# Patient Record
Sex: Male | Born: 1955 | Race: White | Hispanic: No | Marital: Married | Smoking: Never smoker
Health system: Southern US, Community
[De-identification: ages and names within clinical notes are randomized; demographics above are authoritative.]

## PROBLEM LIST (undated history)

## (undated) DIAGNOSIS — D509 Iron deficiency anemia, unspecified: Secondary | ICD-10-CM

## (undated) DIAGNOSIS — J45909 Unspecified asthma, uncomplicated: Secondary | ICD-10-CM

## (undated) DIAGNOSIS — E785 Hyperlipidemia, unspecified: Secondary | ICD-10-CM

## (undated) HISTORY — DX: Hyperlipidemia, unspecified: E78.5

## (undated) HISTORY — PX: HERNIA REPAIR: SHX51

## (undated) HISTORY — DX: Iron deficiency anemia, unspecified: D50.9

## (undated) HISTORY — PX: KNEE SURGERY: SHX244

---

## 2003-08-15 ENCOUNTER — Emergency Department (HOSPITAL_COMMUNITY): Admission: EM | Admit: 2003-08-15 | Discharge: 2003-08-16 | Payer: Self-pay | Admitting: *Deleted

## 2005-01-21 ENCOUNTER — Emergency Department: Payer: Self-pay | Admitting: Emergency Medicine

## 2005-08-07 ENCOUNTER — Ambulatory Visit: Payer: Self-pay | Admitting: Internal Medicine

## 2006-07-02 ENCOUNTER — Ambulatory Visit: Payer: Self-pay | Admitting: Gastroenterology

## 2007-04-27 ENCOUNTER — Emergency Department (HOSPITAL_COMMUNITY): Admission: EM | Admit: 2007-04-27 | Discharge: 2007-04-27 | Payer: Self-pay | Admitting: Emergency Medicine

## 2007-06-14 ENCOUNTER — Ambulatory Visit: Payer: Self-pay | Admitting: Family Medicine

## 2007-06-14 DIAGNOSIS — E785 Hyperlipidemia, unspecified: Secondary | ICD-10-CM

## 2007-06-14 DIAGNOSIS — R5381 Other malaise: Secondary | ICD-10-CM

## 2007-06-14 DIAGNOSIS — R5383 Other fatigue: Secondary | ICD-10-CM

## 2007-06-14 DIAGNOSIS — IMO0002 Reserved for concepts with insufficient information to code with codable children: Secondary | ICD-10-CM | POA: Insufficient documentation

## 2007-06-15 ENCOUNTER — Encounter (INDEPENDENT_AMBULATORY_CARE_PROVIDER_SITE_OTHER): Payer: Self-pay | Admitting: Family Medicine

## 2007-06-17 ENCOUNTER — Telehealth (INDEPENDENT_AMBULATORY_CARE_PROVIDER_SITE_OTHER): Payer: Self-pay | Admitting: *Deleted

## 2007-06-17 ENCOUNTER — Encounter (INDEPENDENT_AMBULATORY_CARE_PROVIDER_SITE_OTHER): Payer: Self-pay | Admitting: Family Medicine

## 2007-06-17 LAB — CONVERTED CEMR LAB
ALT: 42 units/L (ref 0–53)
AST: 25 units/L (ref 0–37)
Albumin: 4.3 g/dL (ref 3.5–5.2)
Basophils Absolute: 0 10*3/uL (ref 0.0–0.1)
Basophils Relative: 1 % (ref 0–1)
Calcium: 9.3 mg/dL (ref 8.4–10.5)
Cholesterol: 192 mg/dL (ref 0–200)
HCT: 41.5 % (ref 39.0–52.0)
LDL Cholesterol: 124 mg/dL — ABNORMAL HIGH (ref 0–99)
Monocytes Absolute: 0.3 10*3/uL (ref 0.1–1.0)
Monocytes Relative: 9 % (ref 3–12)
Platelets: 214 10*3/uL (ref 150–400)
Potassium: 4.2 meq/L (ref 3.5–5.3)
RBC: 5.61 M/uL (ref 4.22–5.81)
Sodium: 139 meq/L (ref 135–145)
TSH: 1.112 microintl units/mL (ref 0.350–5.50)
Triglycerides: 182 mg/dL — ABNORMAL HIGH (ref <150)
VLDL: 36 mg/dL (ref 0–40)
WBC: 4 10*3/uL (ref 4.0–10.5)

## 2007-08-25 ENCOUNTER — Emergency Department (HOSPITAL_COMMUNITY): Admission: EM | Admit: 2007-08-25 | Discharge: 2007-08-25 | Payer: Self-pay | Admitting: Emergency Medicine

## 2008-11-09 ENCOUNTER — Emergency Department (HOSPITAL_COMMUNITY): Admission: EM | Admit: 2008-11-09 | Discharge: 2008-11-09 | Payer: Self-pay | Admitting: Emergency Medicine

## 2009-01-28 ENCOUNTER — Ambulatory Visit (HOSPITAL_COMMUNITY): Admission: RE | Admit: 2009-01-28 | Discharge: 2009-01-28 | Payer: Self-pay | Admitting: Orthopaedic Surgery

## 2009-02-01 ENCOUNTER — Encounter (HOSPITAL_COMMUNITY): Admission: RE | Admit: 2009-02-01 | Discharge: 2009-03-03 | Payer: Self-pay | Admitting: Orthopaedic Surgery

## 2009-03-04 ENCOUNTER — Encounter (HOSPITAL_COMMUNITY): Admission: RE | Admit: 2009-03-04 | Discharge: 2009-03-24 | Payer: Self-pay | Admitting: Orthopaedic Surgery

## 2009-11-09 ENCOUNTER — Emergency Department: Payer: Self-pay | Admitting: Emergency Medicine

## 2010-06-29 LAB — DIFFERENTIAL
Basophils Relative: 1 % (ref 0–1)
Eosinophils Relative: 10 % — ABNORMAL HIGH (ref 0–5)
Monocytes Relative: 8 % (ref 3–12)
Neutro Abs: 1.8 10*3/uL (ref 1.7–7.7)
Neutrophils Relative %: 42 % — ABNORMAL LOW (ref 43–77)

## 2010-06-29 LAB — URINALYSIS, ROUTINE W REFLEX MICROSCOPIC
Glucose, UA: NEGATIVE mg/dL
Protein, ur: NEGATIVE mg/dL
Urobilinogen, UA: 0.2 mg/dL (ref 0.0–1.0)
pH: 6.5 (ref 5.0–8.0)

## 2010-06-29 LAB — CBC
MCV: 76.3 fL — ABNORMAL LOW (ref 78.0–100.0)
Platelets: 207 10*3/uL (ref 150–400)
RBC: 5.51 MIL/uL (ref 4.22–5.81)

## 2010-08-12 NOTE — H&P (Signed)
[name], [name]            ACCOUNT NO.:  [id number]   MEDICAL RECORD NO.:  [id number]          PATIENT TYPE:  AMB   LOCATION:  DAY                           FACILITY:  [facility]   PHYSICIAN:  [name], M.D. DATE OF BIRTH:  [birth year]   DATE OF ADMISSION:  DATE OF DISCHARGE:  LH                              HISTORY & PHYSICAL   CHIEF COMPLAINT:  My right knee hurts.   The patient is a 55 year old male with pain and tenderness in the right  knee.  He has had an on-th-job injury to his right knee in the past.  I  first saw him the office on May 02, 2007.  He had an on-the-job  injury on April 22, 2007.  He slipped in a hole covered by snow and  ice and injured his right knee.  I was concerned about possible meniscal  tear.  An MRI was done at [facility] on May 07, 2007  showing a posterior medial Baker's cyst, joint effusion and AC strain  but no evidence meniscus tear.  The patient was informed of the findings  on February 12 and he stayed out of work for a period of time.  He began  physical therapy.  He was [facility] in [facility]  occasions.  His motion slowly improved and his AC strain slowly  improved.  He was eventually discharged from therapy.  However, his pain  in his knee continued and still had some difficulty.  He had what he  called good days and bad days, weather related and activity related.  I  have followed him in the office.  He was on Voltaren and Flexeril.  He  is also taking hydrocodone for his pain.  On June 10, 2008, he was  having more pain and more tenderness.  It had been over a year and he  was still having difficulty.  I was concerned about a meniscal tear that  may not have been evident on the previous MRI.  I recommended a new MRI  be performed.  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  I explained the findings to him and  recommended consideration for an  MRI.  This is an on-the-job injury and surgery needed to be approved  through [employer].  He had a Production assistant, radio with him.  Eventually approval was given for surgery on the right knee.  It was  scheduled for June 15.  I have explained the procedure of right knee  arthroscopy, the risks and imponderables.  He appears to understand.   He lives in [place] and works in [place] where the injury  occurred.  He denies lung disease, heart disease, kidney disease,  stroke, paralysis, weakness, hypertension, diabetes, cancer, ulcer  disease or circulatory problems.   He has no allergies.   He does not smoke or use alcoholic beverages.  Dr. Karie Schwalbe is his family  physician in [place].  He says the only procedure he has had done is a colonoscopy.  Arthritis  and cancer run in the family.  The patient is married, lives in  [place] as stated.   VITAL SIGNS: Normal.  HEENT: Negative.  He is alert, cooperative, oriented.  NECK:  Supple.  LUNGS: Clear to P and A.  HEART: Regular without murmur heard.  ABDOMEN:  Soft, nontender without masses.  EXTREMITIES: Right knee has some slight effusion, pain and tenderness  more medially with a weakly positive medial McMurray.  Drawer sign is  negative, Lachman negative.  He has no distal edema.  CNS: Intact.  SKIN:  Intact.   IMPRESSION:  Tear right knee medial meniscus, workers compensation  injury.   PLAN:  Arthroscopy of the left knee.  I discussed with the patient the  planned procedure, risks and imponderables.  He appears to understand.  His labs are pending.                                            ______________________________  [name], M.D.     JWK/MEDQ  D:  09/07/2008  T:  09/07/2008  Job:  [id number]

## 2012-09-09 ENCOUNTER — Encounter: Payer: Self-pay | Admitting: *Deleted

## 2012-09-10 ENCOUNTER — Encounter: Payer: Self-pay | Admitting: Cardiovascular Disease

## 2012-09-10 ENCOUNTER — Ambulatory Visit (INDEPENDENT_AMBULATORY_CARE_PROVIDER_SITE_OTHER): Payer: BC Managed Care – PPO | Admitting: Cardiovascular Disease

## 2012-09-10 VITALS — BP 134/60 | HR 58 | Ht 69.0 in | Wt 225.5 lb

## 2012-09-10 DIAGNOSIS — R079 Chest pain, unspecified: Secondary | ICD-10-CM

## 2012-09-10 DIAGNOSIS — R9439 Abnormal result of other cardiovascular function study: Secondary | ICD-10-CM

## 2012-09-10 NOTE — Progress Notes (Signed)
HPI  This is a pleasant 57 year old African American male who was referred by Dr. Dario Guardian for evaluation of chest pain and abnormal nuclear stress test. The patient has no previous cardiac history. He has no history of diabetes, hypertension or tobacco use. There is no family history of coronary artery disease. He does have mild hyperlipidemia. Recently, he had 3 episodes of substernal sharp chest discomfort which lasted only for about 10-15 seconds. One episode happened when he was driving. These episodes have not been exertional. He denies any shortness of breath, nausea, diaphoresis or dizziness. He went to urgent care few weeks ago when he was told his EKG was abnormal and thus was referred for a stress test. He underwent a treadmill nuclear stress test on Friday. He was able to exercise for 5 minutes and 40 seconds. He achieved a maximum heart rate of 139 beats per minute. The scan showed evidence of moderate size inferior wall ischemia with an ejection fraction of 57%.  Allergies  Allergen Reactions  . Hydrocodone     Dizziness Hallucination nervous     No current outpatient prescriptions on file prior to visit.   No current facility-administered medications on file prior to visit.     Past Medical History  Diagnosis Date  . Iron deficiency anemia, unspecified   . Other and unspecified hyperlipidemia      Past Surgical History  Procedure Laterality Date  . Knee surgery      right knee  . Hernia repair       History reviewed. No pertinent family history.   History   Social History  . Marital Status: Married    Spouse Name: N/A    Number of Children: N/A  . Years of Education: N/A   Occupational History  . Not on file.   Social History Main Topics  . Smoking status: Never Smoker   . Smokeless tobacco: Not on file  . Alcohol Use: No  . Drug Use: No  . Sexually Active: Not on file   Other Topics Concern  . Not on file   Social History Narrative  . No  narrative on file     ROS Constitutional: Negative for fever, chills, diaphoresis, activity change, appetite change and fatigue.  HENT: Negative for hearing loss, nosebleeds, congestion, sore throat, facial swelling, drooling, trouble swallowing, neck pain, voice change, sinus pressure and tinnitus.  Eyes: Negative for photophobia, pain, discharge and visual disturbance.  Respiratory: Negative for apnea, cough,  shortness of breath and wheezing.  Cardiovascular: Negative for palpitations and leg swelling.  Gastrointestinal: Negative for nausea, vomiting, abdominal pain, diarrhea, constipation, blood in stool and abdominal distention.  Genitourinary: Negative for dysuria, urgency, frequency, hematuria and decreased urine volume.  Musculoskeletal: Negative for myalgias, back pain, joint swelling, arthralgias and gait problem.  Skin: Negative for color change, pallor, rash and wound.  Neurological: Negative for dizziness, tremors, seizures, syncope, speech difficulty, weakness, light-headedness, numbness and headaches.  Psychiatric/Behavioral: Negative for suicidal ideas, hallucinations, behavioral problems and agitation. The patient is not nervous/anxious.     PHYSICAL EXAM   BP 134/60  Pulse 58  Ht 5\' 9"  (1.753 m)  Wt 225 lb 8 oz (102.286 kg)  BMI 33.29 kg/m2 Constitutional: He is oriented to person, place, and time. He appears well-developed and well-nourished. No distress.  HENT: No nasal discharge.  Head: Normocephalic and atraumatic.  Eyes: Pupils are equal and round. Right eye exhibits no discharge. Left eye exhibits no discharge.  Neck: Normal range  of motion. Neck supple. No JVD present. No thyromegaly present.  Cardiovascular: Normal rate, regular rhythm, normal heart sounds and. Exam reveals no gallop and no friction rub. No murmur heard.  Pulmonary/Chest: Effort normal and breath sounds normal. No stridor. No respiratory distress. He has no wheezes. He has no rales. He  exhibits no tenderness.  Abdominal: Soft. Bowel sounds are normal. He exhibits no distension. There is no tenderness. There is no rebound and no guarding.  Musculoskeletal: Normal range of motion. He exhibits no edema and no tenderness.  Neurological: He is alert and oriented to person, place, and time. Coordination normal.  Skin: Skin is warm and dry. No rash noted. He is not diaphoretic. No erythema. No pallor.  Psychiatric: He has a normal mood and affect. His behavior is normal. Judgment and thought content normal.      ZOX:WRUEA  Bradycardia  -  Negative T-waves  -Possible  Anterolateral  ischemia.   ABNORMAL    ASSESSMENT AND PLAN

## 2012-09-10 NOTE — Assessment & Plan Note (Addendum)
The patient's chest pain is clearly atypical and has been nonexertional. He also has minimal risk factors for coronary artery disease. Nonetheless, he has an abnormal baseline EKG with anterior T wave changes. Recent nuclear stress test showed evidence of inferior wall ischemia. However, this could have been due to diaphragm attenuation especially in the setting of obesity. I think the best option is to proceed with CT angiography of the coronary arteries to identify whether he has coronary artery disease or not.

## 2012-09-10 NOTE — Patient Instructions (Addendum)
Your physician has requested that you have cardiac CT. Cardiac computed tomography (CT) is a painless test that uses an x-ray machine to take clear, detailed pictures of your heart. For further information please visit [email or web address] Please follow instruction sheet as given.  Follow up as needed.

## 2012-09-11 ENCOUNTER — Encounter: Payer: Self-pay | Admitting: *Deleted

## 2012-09-11 ENCOUNTER — Telehealth: Payer: Self-pay

## 2012-09-11 NOTE — Telephone Encounter (Signed)
LMTCB re: scheduling CTA  I left our [place] office # as well as [address] #

## 2012-09-11 NOTE — Telephone Encounter (Signed)
Message copied by [facility], Jordain Radin E on Wed Sep 11, 2012  3:34 PM ------      Message from: Connye Burkitt      Created: Wed Sep 11, 2012 11:33 AM      Regarding: RE: CTA coronary arteries       Can you try? I will try to reach him also. If you reach him can you tell him to call me at [phone]      ----- Message -----         From: Marcelle Overlie, RN         Sent: 09/11/2012  11:06 AM           To: [name] Lloyd-Fate      Subject: RE: CTA coronary arteries                                Thanks! Do I need to keep trying him or will you do this?      ----- Message -----         From: Connye Burkitt         Sent: 09/11/2012  11:03 AM           To: Marcelle Overlie, RN      Subject: FW: CTA coronary arteries                                  Cardiac Ct scheduled for 09/18/12 @ 1 pm with Dr. Fatima Blank to reach patient.       ----- Message -----         From: Britt Bolognese         Sent: 09/10/2012  12:56 PM           To: [name] Lloyd-Fate      Subject: RE: CTA coronary arteries                                OK TO SCHEDULE            BCBS JYNW#[id number] EXP 10-09-12      ----- Message -----         From: Connye Burkitt         Sent: 09/10/2012  11:48 AM           To: Jeannetta Nap      Subject: FW: CTA coronary arteries                                  pls pre-cert and let me know if insurance approves or deny test.      BLUE CROSS BLUE SHIELD/BCBS [place] PPO      ----- Message -----         From: Connye Burkitt         Sent: 09/10/2012  11:47 AM           To: [name] Lloyd-Fate      Subject: FW: CTA coronary arteries                                  test      ----- Message -----  From: Marcelle Overlie, RN         Sent: 09/10/2012  11:32 AM           To: Connye Burkitt, Lch Pre Cert / Auth      Subject: CTA coronary arteries                                    Arida      Complete cardiac CTA at [facility]      Dx:CP and  positive stress test                                           ------

## 2012-09-12 NOTE — Telephone Encounter (Signed)
Per sharonda in [place], she spoke with pt [name] yesterday

## 2012-09-18 ENCOUNTER — Ambulatory Visit (HOSPITAL_COMMUNITY): Admission: RE | Admit: 2012-09-18 | Payer: BC Managed Care – PPO | Source: Ambulatory Visit

## 2012-09-23 ENCOUNTER — Telehealth: Payer: Self-pay

## 2012-09-23 NOTE — Telephone Encounter (Signed)
Message copied by [facility], Jaeden Messer E on Mon Sep 23, 2012 12:19 PM ------      Message from: Lorine Bears A      Created: Mon Sep 23, 2012 12:11 PM      Regarding: RE: fyi       Let PCP know as well.             ----- Message -----         From: Marcelle Overlie, RN         Sent: 09/23/2012  12:05 PM           To: Iran Ouch, MD      Subject: fyi                                                      Pt n/s for cta at [facility] scheduled for 6/17...fyi       ------

## 2012-09-23 NOTE — Telephone Encounter (Signed)
See below

## 2013-01-23 ENCOUNTER — Telehealth: Payer: Self-pay

## 2013-01-23 ENCOUNTER — Encounter: Payer: Self-pay | Admitting: *Deleted

## 2013-01-23 DIAGNOSIS — R079 Chest pain, unspecified: Secondary | ICD-10-CM

## 2013-01-23 NOTE — Telephone Encounter (Signed)
Dr. Dario Guardian, pt called him and  needs clearance for colonoscopy. Colonoscopy is scheduled for Monday. Not sure if pt needs to be seen again.

## 2013-01-23 NOTE — Telephone Encounter (Signed)
I spoke with [name] in the [place] office to schedule the patient's Cardiac CTA. First available is on 02/04/13 at 2:00 pm. Per [name] she will mail the patient's instructions to him. I have discussed this with the patient and he is aware and agreeable. He will come on 11/3 for lab work to be done in the [place] office.

## 2013-01-23 NOTE — Telephone Encounter (Signed)
Reschedule him for cardiac CTA at [facility] to be read by cardiology.

## 2013-01-23 NOTE — Telephone Encounter (Signed)
Spoke w/ pt.  He reports that he needs a colonoscopy, but Dr. Dario Guardian will not give him clearance b/c he did not have his CTA that Dr. Kirke Corin ordered in 6/14. Pt reports that he "had something done.  I didn't miss anything." Informed pt that CTA was cancelled/no show, but he insists that he went. He states that if he needs to resched, he would like to have it sometime next week. Informed pt that I would find out if Dr. Kirke Corin would like to have this rescheduled and call him back.  Please advise.  Thank you.

## 2013-01-27 ENCOUNTER — Ambulatory Visit (INDEPENDENT_AMBULATORY_CARE_PROVIDER_SITE_OTHER): Payer: BC Managed Care – PPO

## 2013-01-27 DIAGNOSIS — R079 Chest pain, unspecified: Secondary | ICD-10-CM

## 2013-01-28 LAB — BASIC METABOLIC PANEL
Calcium: 9.6 mg/dL (ref 8.7–10.2)
Chloride: 103 mmol/L (ref 97–108)
Creatinine, Ser: 0.99 mg/dL (ref 0.76–1.27)
GFR calc non Af Amer: 84 mL/min/{1.73_m2} (ref >59)
Glucose: 80 mg/dL (ref 65–99)

## 2013-02-04 ENCOUNTER — Encounter (HOSPITAL_COMMUNITY): Payer: Self-pay

## 2013-02-04 ENCOUNTER — Ambulatory Visit (HOSPITAL_COMMUNITY)
Admission: RE | Admit: 2013-02-04 | Discharge: 2013-02-04 | Disposition: A | Discharge status: Home / Self Care | Payer: BC Managed Care – PPO | Source: Ambulatory Visit | Attending: Cardiovascular Disease | Admitting: Cardiovascular Disease

## 2013-02-04 VITALS — BP 118/64 | HR 54

## 2013-02-04 DIAGNOSIS — K7689 Other specified diseases of liver: Secondary | ICD-10-CM | POA: Insufficient documentation

## 2013-02-04 DIAGNOSIS — J479 Bronchiectasis, uncomplicated: Secondary | ICD-10-CM | POA: Insufficient documentation

## 2013-02-04 DIAGNOSIS — R079 Chest pain, unspecified: Secondary | ICD-10-CM | POA: Insufficient documentation

## 2013-02-04 DIAGNOSIS — R911 Solitary pulmonary nodule: Secondary | ICD-10-CM | POA: Insufficient documentation

## 2013-02-04 DIAGNOSIS — R9439 Abnormal result of other cardiovascular function study: Secondary | ICD-10-CM

## 2013-02-04 MED ORDER — IOHEXOL 350 MG/ML SOLN
80.0000 mL | Freq: Once | INTRAVENOUS | Status: AC | PRN
  Administered 2013-02-04: 80 mL via INTRAVENOUS

## 2013-02-04 MED ORDER — METOPROLOL TARTRATE 1 MG/ML IV SOLN
INTRAVENOUS | Status: AC
  Filled 2013-02-04: qty 5

## 2013-02-04 MED ORDER — METOPROLOL TARTRATE 1 MG/ML IV SOLN
5.0000 mg | INTRAVENOUS | Status: DC | PRN
  Administered 2013-02-04: 3 mg via INTRAVENOUS
  Filled 2013-02-04: qty 5

## 2013-02-04 MED ORDER — METOPROLOL TARTRATE 1 MG/ML IV SOLN
2.5000 mg | INTRAVENOUS | Status: DC | PRN
  Filled 2013-02-04: qty 5

## 2013-02-04 MED ORDER — NITROGLYCERIN 0.4 MG SL SUBL
0.4000 mg | SUBLINGUAL_TABLET | SUBLINGUAL | Status: DC | PRN
  Filled 2013-02-04: qty 25

## 2013-02-04 MED ORDER — NITROGLYCERIN 0.4 MG SL SUBL
SUBLINGUAL_TABLET | SUBLINGUAL | Status: AC
  Administered 2013-02-04: 0.4 mg
  Filled 2013-02-04: qty 25

## 2013-02-05 ENCOUNTER — Telehealth: Payer: Self-pay

## 2013-02-05 NOTE — Telephone Encounter (Signed)
Spoke w/ pt.  He is aware of results.  

## 2013-02-05 NOTE — Telephone Encounter (Signed)
Message copied by Marilynne Halsted on Wed Feb 05, 2013  9:57 AM ------      Message from: Lorine Bears A      Created: Tue Feb 04, 2013  5:05 PM       Inform patient that CTA was normal. Sent a copy to Dr. Dario Guardian. ------

## 2013-02-13 ENCOUNTER — Ambulatory Visit: Payer: Self-pay | Admitting: Gastroenterology

## 2013-02-19 ENCOUNTER — Ambulatory Visit: Payer: Self-pay | Admitting: Gastroenterology

## 2013-02-19 LAB — CBC WITH DIFFERENTIAL/PLATELET
Basophil #: 0 10*3/uL (ref 0.0–0.1)
Basophil %: 0.9 %
Eosinophil #: 0.3 10*3/uL (ref 0.0–0.7)
Eosinophil %: 6.5 %
Lymphocyte %: 46.7 %
MCHC: 33.1 g/dL (ref 32.0–36.0)
Neutrophil %: 38 %
Platelet: 193 10*3/uL (ref 150–440)
RBC: 5.63 10*6/uL (ref 4.40–5.90)
RDW: 15.7 % — ABNORMAL HIGH (ref 11.5–14.5)

## 2013-02-19 LAB — FERRITIN: Ferritin (ARMC): 194 ng/mL (ref 8–388)

## 2013-02-19 LAB — IRON: Iron: 85 ug/dL (ref 65–175)

## 2013-09-24 NOTE — Telephone Encounter (Signed)
This encounter was created in error - please disregard.

## 2014-10-25 ENCOUNTER — Encounter (HOSPITAL_COMMUNITY): Payer: Self-pay | Admitting: Emergency Medicine

## 2014-10-25 ENCOUNTER — Emergency Department (HOSPITAL_COMMUNITY)
Admission: EM | Admit: 2014-10-25 | Discharge: 2014-10-25 | Disposition: A | Discharge status: Home / Self Care | Payer: Self-pay | Attending: Emergency Medicine | Admitting: Emergency Medicine

## 2014-10-25 DIAGNOSIS — R252 Cramp and spasm: Secondary | ICD-10-CM | POA: Insufficient documentation

## 2014-10-25 DIAGNOSIS — Z862 Personal history of diseases of the blood and blood-forming organs and certain disorders involving the immune mechanism: Secondary | ICD-10-CM | POA: Insufficient documentation

## 2014-10-25 DIAGNOSIS — Z8639 Personal history of other endocrine, nutritional and metabolic disease: Secondary | ICD-10-CM | POA: Insufficient documentation

## 2014-10-25 LAB — CBC WITH DIFFERENTIAL/PLATELET
Basophils Absolute: 0 10*3/uL (ref 0.0–0.1)
Basophils Relative: 0 % (ref 0–1)
EOS ABS: 0.2 10*3/uL (ref 0.0–0.7)
EOS PCT: 3 % (ref 0–5)
HCT: 39.3 % (ref 39.0–52.0)
HEMOGLOBIN: 13.3 g/dL (ref 13.0–17.0)
LYMPHS ABS: 1.8 10*3/uL (ref 0.7–4.0)
LYMPHS PCT: 31 % (ref 12–46)
MCH: 24.8 pg — AB (ref 26.0–34.0)
MCHC: 33.8 g/dL (ref 30.0–36.0)
MCV: 73.3 fL — AB (ref 78.0–100.0)
MONOS PCT: 8 % (ref 3–12)
Monocytes Absolute: 0.4 10*3/uL (ref 0.1–1.0)
Neutro Abs: 3.4 10*3/uL (ref 1.7–7.7)
Neutrophils Relative %: 58 % (ref 43–77)
Platelets: 211 10*3/uL (ref 150–400)
RBC: 5.36 MIL/uL (ref 4.22–5.81)
RDW: 15 % (ref 11.5–15.5)
WBC: 5.8 10*3/uL (ref 4.0–10.5)

## 2014-10-25 LAB — BASIC METABOLIC PANEL
Anion gap: 12 (ref 5–15)
BUN: 12 mg/dL (ref 6–20)
CALCIUM: 9.6 mg/dL (ref 8.9–10.3)
CO2: 23 mmol/L (ref 22–32)
Chloride: 103 mmol/L (ref 101–111)
Creatinine, Ser: 1.3 mg/dL — ABNORMAL HIGH (ref 0.61–1.24)
GFR, EST NON AFRICAN AMERICAN: 59 mL/min — AB (ref >60)
GLUCOSE: 99 mg/dL (ref 65–99)
POTASSIUM: 3.8 mmol/L (ref 3.5–5.1)
SODIUM: 138 mmol/L (ref 135–145)

## 2014-10-25 MED ORDER — SODIUM CHLORIDE 0.9 % IV BOLUS (SEPSIS)
1000.0000 mL | Freq: Once | INTRAVENOUS | Status: AC
  Administered 2014-10-25: 1000 mL via INTRAVENOUS

## 2014-10-25 NOTE — ED Provider Notes (Signed)
CSN: [id number]     Arrival date & time 10/25/14  2033 History   First MD Initiated Contact with Patient 10/25/14 2148     Chief Complaint  Patient presents with  . Muscle Pain     (Consider location/radiation/quality/duration/timing/severity/associated sxs/prior Treatment) HPI Comments: 59 yo male with muscle cramps.  Started today and has been intermittent. Moderate in intensity.  Currently no cramping.  Cramps located in abdominal musculature, neck, and back.  This is in the setting of increased sweating due to heat and work.  He is worried his potassium may be low.   Past Medical History  Diagnosis Date  . Iron deficiency anemia, unspecified   . Other and unspecified hyperlipidemia    Past Surgical History  Procedure Laterality Date  . Knee surgery      right knee  . Hernia repair     No family history on file. History  Substance Use Topics  . Smoking status: Never Smoker   . Smokeless tobacco: Not on file  . Alcohol Use: No    Review of Systems  Respiratory: Negative for shortness of breath.   Cardiovascular: Negative for chest pain.  Gastrointestinal: Negative for nausea, vomiting and diarrhea.  All other systems reviewed and are negative.     Allergies  Hydrocodone  Home Medications   Prior to Admission medications   Not on File   BP 134/71 mmHg  Pulse 69  Temp(Src) 98.6 F (37 C) (Oral)  Resp 14  Ht  (1.753 m)  Wt 133 lb (60.328 kg)  BMI 19.63 kg/m2  SpO2 96% Physical Exam  Constitutional: He is oriented to person, place, and time. He appears well-developed and well-nourished. No distress.  HENT:  Head: Normocephalic and atraumatic.  Eyes: Conjunctivae are normal. No scleral icterus.  Neck: Neck supple.  Cardiovascular: Normal rate and intact distal pulses.   Pulmonary/Chest: Effort normal. No stridor. No respiratory distress.  Abdominal: Soft. Normal appearance. He exhibits no distension. There is no tenderness. There is no rebound and  no guarding.  Musculoskeletal:       Cervical back: He exhibits no tenderness and no spasm.       Thoracic back: He exhibits no tenderness and no spasm.       Lumbar back: He exhibits no tenderness and no spasm.  Neurological: He is alert and oriented to person, place, and time.  Skin: Skin is warm and dry. No rash noted.  Psychiatric: He has a normal mood and affect. His behavior is normal.  Nursing note and vitals reviewed.   ED Course  Procedures (including critical care time) Labs Review Labs Reviewed  CBC WITH DIFFERENTIAL/PLATELET - Abnormal; Notable for the following:    MCV 73.3 (*)    MCH 24.8 (*)    All other components within normal limits  BASIC METABOLIC PANEL - Abnormal; Notable for the following:    Creatinine, Ser 1.30 (*)    GFR calc non Af Amer 59 (*)    All other components within normal limits    Imaging Review No results found.   EKG Interpretation None      MDM   Final diagnoses:  Muscle cramping    59 yo male with intermittent muscle cramping.  Not cramping currently.  He appears mildly dehydrated.  I think IV fluids will help.    Felt better after fluids.  Stable for dc home.   Blake Divine, MD 10/25/14 [phone]

## 2014-10-25 NOTE — ED Notes (Signed)
Pt able to stand and ambulate without difficulty; denies dizziness

## 2014-10-25 NOTE — Discharge Instructions (Signed)

## 2014-10-25 NOTE — ED Notes (Addendum)
Pt c/o upper back and lower abdominal pain onset today. Denies pain at present. Reports improvement of symptoms after fluids

## 2014-10-25 NOTE — ED Notes (Signed)
Pt. reports muscle cramps at upper back and abdomen onset today , pt. stated " I think my Pottasium is high " , denies chest discomfort or palpitations .

## 2015-04-16 ENCOUNTER — Encounter (HOSPITAL_COMMUNITY): Payer: Self-pay

## 2015-04-16 ENCOUNTER — Emergency Department (HOSPITAL_COMMUNITY)
Admission: EM | Admit: 2015-04-16 | Discharge: 2015-04-16 | Disposition: A | Discharge status: Home / Self Care | Payer: Non-veteran care | Attending: Emergency Medicine | Admitting: Emergency Medicine

## 2015-04-16 ENCOUNTER — Emergency Department (HOSPITAL_COMMUNITY): Payer: Non-veteran care

## 2015-04-16 DIAGNOSIS — R05 Cough: Secondary | ICD-10-CM | POA: Diagnosis not present

## 2015-04-16 DIAGNOSIS — J3489 Other specified disorders of nose and nasal sinuses: Secondary | ICD-10-CM | POA: Diagnosis not present

## 2015-04-16 DIAGNOSIS — R509 Fever, unspecified: Secondary | ICD-10-CM | POA: Insufficient documentation

## 2015-04-16 DIAGNOSIS — Z862 Personal history of diseases of the blood and blood-forming organs and certain disorders involving the immune mechanism: Secondary | ICD-10-CM | POA: Diagnosis not present

## 2015-04-16 DIAGNOSIS — R197 Diarrhea, unspecified: Secondary | ICD-10-CM | POA: Insufficient documentation

## 2015-04-16 DIAGNOSIS — Z8639 Personal history of other endocrine, nutritional and metabolic disease: Secondary | ICD-10-CM | POA: Insufficient documentation

## 2015-04-16 DIAGNOSIS — R0989 Other specified symptoms and signs involving the circulatory and respiratory systems: Secondary | ICD-10-CM | POA: Diagnosis not present

## 2015-04-16 DIAGNOSIS — R059 Cough, unspecified: Secondary | ICD-10-CM

## 2015-04-16 MED ORDER — BENZONATATE 100 MG PO CAPS: 100.0000 mg | ORAL_CAPSULE | Freq: Three times a day (TID) | ORAL | Status: DC

## 2015-04-16 MED ORDER — AZITHROMYCIN 250 MG PO TABS: ORAL_TABLET | ORAL | Status: DC

## 2015-04-16 MED ORDER — PSEUDOEPHEDRINE HCL 60 MG PO TABS: 60.0000 mg | ORAL_TABLET | ORAL | Status: DC | PRN

## 2015-04-16 NOTE — ED Notes (Signed)
Pt reports productive cough for the past few days and pain in chest only when coughing.  Unsure if has had fever.  Reports hot and cold episodes.

## 2015-04-16 NOTE — ED Provider Notes (Signed)
CSN: [id number]     Arrival date & time 04/16/15  1007 History  By signing my name below, I, Marica Otter, attest that this documentation has been prepared under the direction and in the presence of No att. providers found. Electronically Signed: Marica Otter, ED Scribe. 04/18/2015. 10:45 AM.  Chief Complaint  Patient presents with  . Cough   The history is provided by the patient. No language interpreter was used.   PCP: Sherrie Mustache, MD HPI Comments: [name] is a 59 y.o. male who presents to the Emergency Department complaining of productive cough onset 3 days ago. Associated Sx include chest tightness secondary to coughing fits, rhinorrhea, watering of eyes, intermittent diarrhea onset Wednesday, intermittent subjective fever onset yesterday. Pt denies constipation, n/v or any other Sx at this time. Pt denies Hx of tobacco use, EtOH use, or illicit drug use. Pt denies sick contact. Pt denies any recent travels.Pt denies any chronic health conditions.   Past Medical History  Diagnosis Date  . Iron deficiency anemia, unspecified   . Other and unspecified hyperlipidemia    Past Surgical History  Procedure Laterality Date  . Knee surgery      right knee  . Hernia repair     No family history on file. Social History  Substance Use Topics  . Smoking status: Never Smoker   . Smokeless tobacco: None  . Alcohol Use: No    Review of Systems  Constitutional: Positive for fever.  HENT: Positive for rhinorrhea.   Eyes: Positive for discharge.  Respiratory: Positive for chest tightness.   Gastrointestinal: Positive for diarrhea. Negative for nausea, vomiting and constipation.  All other systems reviewed and are negative.  Allergies  Hydrocodone  Home Medications   Prior to Admission medications   Medication Sig Start Date End Date Taking? Authorizing Provider  azithromycin (ZITHROMAX Z-PAK) 250 MG tablet 2 po day one, then 1 daily x 4 days 04/16/15   Marily Memos, MD   benzonatate (TESSALON) 100 MG capsule Take 1 capsule (100 mg total) by mouth every 8 (eight) hours. 04/16/15   Marily Memos, MD  pseudoephedrine (SUDAFED) 60 MG tablet Take 1 tablet (60 mg total) by mouth every 4 (four) hours as needed for congestion. 04/16/15   Marily Memos, MD   Triage Vitals: BP 128/69 mmHg  Pulse 61  Temp(Src) 98.6 F (37 C) (Oral)  Resp 20  Ht  (1.753 m)  Wt 228 lb (103.42 kg)  BMI 33.65 kg/m2  SpO2 100% Physical Exam  Constitutional: He is oriented to person, place, and time. He appears well-developed and well-nourished.  HENT:  Head: Normocephalic.  Nose: Mucosal edema present.  Mouth/Throat: Oropharynx is clear and moist and mucous membranes are normal.  Eyes: EOM are normal.  Neck: Normal range of motion.  Cardiovascular: Normal rate and regular rhythm.   No murmur heard. Pulmonary/Chest: Effort normal. No respiratory distress. He has rales in the right lower field.  Abdominal: Soft. Bowel sounds are normal. He exhibits no distension. There is no tenderness.  Musculoskeletal: Normal range of motion.  Neurological: He is alert and oriented to person, place, and time.  Psychiatric: He has a normal mood and affect.  Nursing note and vitals reviewed.   ED Course  Procedures (including critical care time)  DIAGNOSTIC STUDIES: Oxygen Saturation is 100% on ra, nl by my interpretation.    COORDINATION OF CARE: 10:41 AM: Discussed treatment plan which includes Rx for antibiotics, cough meds, Sudafed with pt at bedside; patient  verbalizes understanding and agrees with treatment plan.  Imaging Review Dg Chest 2 View  04/16/2015  CLINICAL DATA:  Chest pain, cough and congestion since 04/12/2014. Initial encounter. EXAM: CHEST  2 VIEW COMPARISON:  PA and lateral chest 08/27/2012. FINDINGS: The lungs are clear. Heart size is normal. There is no pneumothorax or pleural effusion. No focal bony abnormality. IMPRESSION: No acute disease. Electronically Signed    By: Drusilla Kanner M.D.   On: 04/16/2015 10:42   I have personally reviewed and evaluated these images as part of my medical decision-making.  MDM   Final diagnoses:  Cough    Likely bronchitis.  Will start abx 2/2 age, history, associated GI illnesses (Possibly legionella) and duration of symptoms. No indication for continued observation, admission or advanced imaging since patient is not tachypneic, tachycardic or hypoxic at this time.    I personally performed the services described in this documentation, which was scribed in my presence. The recorded information has been reviewed and is accurate.    Marily Memos, MD 04/18/15 [phone]

## 2016-01-17 ENCOUNTER — Emergency Department (HOSPITAL_COMMUNITY)
Admission: EM | Admit: 2016-01-17 | Discharge: 2016-01-17 | Disposition: A | Discharge status: Home / Self Care | Payer: Non-veteran care | Attending: Emergency Medicine | Admitting: Emergency Medicine

## 2016-01-17 ENCOUNTER — Encounter (HOSPITAL_COMMUNITY): Payer: Self-pay | Admitting: Emergency Medicine

## 2016-01-17 ENCOUNTER — Emergency Department (HOSPITAL_COMMUNITY): Payer: Medicare Other

## 2016-01-17 DIAGNOSIS — Z791 Long term (current) use of non-steroidal anti-inflammatories (NSAID): Secondary | ICD-10-CM | POA: Insufficient documentation

## 2016-01-17 DIAGNOSIS — J209 Acute bronchitis, unspecified: Secondary | ICD-10-CM

## 2016-01-17 MED ORDER — DEXAMETHASONE SODIUM PHOSPHATE 4 MG/ML IJ SOLN
10.0000 mg | Freq: Once | INTRAMUSCULAR | Status: AC
  Administered 2016-01-17: 10 mg via INTRAMUSCULAR
  Filled 2016-01-17: qty 3

## 2016-01-17 MED ORDER — AZITHROMYCIN 250 MG PO TABS: 250.0000 mg | ORAL_TABLET | Freq: Every day | ORAL | 0 refills | Status: DC

## 2016-01-17 MED ORDER — ALBUTEROL SULFATE HFA 108 (90 BASE) MCG/ACT IN AERS
2.0000 | INHALATION_SPRAY | RESPIRATORY_TRACT | Status: DC | PRN
  Administered 2016-01-17: 2 via RESPIRATORY_TRACT
  Filled 2016-01-17: qty 6.7

## 2016-01-17 NOTE — ED Triage Notes (Addendum)
Pt reports dx of bronchitis 3 months ago.  Pt states he continues to have bronchitis symptoms.  Pt reports wheezing and fluid in lungs. Pt coughing up yellow sputum.  Pt alert and oriented, airway clear. Pt wanting a refill of antibiotics.

## 2016-01-17 NOTE — ED Provider Notes (Signed)
[facility] Provider Note   CSN: [id number] Arrival date & time: 01/17/16  1130     History   Chief Complaint Chief Complaint  Patient presents with  . Cough    HPI [name] is a 60 y.o. male.  HPI Pt reports dx of bronchitis 3 months ago.  Pt states he continues to have bronchitis symptoms.  Pt reports wheezing and fluid in lungs.  Pt alert and oriented, airway clear. Pt wanting a refill of antibiotics Past Medical History:  Diagnosis Date  . Iron deficiency anemia, unspecified   . Other and unspecified hyperlipidemia     Patient Active Problem List   Diagnosis Date Noted  . Chest pain 09/10/2012  . HYPERLIPIDEMIA 06/14/2007  . MALAISE AND FATIGUE 06/14/2007  . KNEE SPRAIN, RIGHT 06/14/2007    Past Surgical History:  Procedure Laterality Date  . HERNIA REPAIR    . KNEE SURGERY     right knee       Home Medications    Prior to Admission medications   Medication Sig Start Date End Date Taking? Authorizing Provider  ibuprofen (ADVIL,MOTRIN) 200 MG tablet Take 400 mg by mouth every 6 (six) hours as needed.   Yes Historical Provider, MD  azithromycin (ZITHROMAX) 250 MG tablet Take 1 tablet (250 mg total) by mouth daily. Take first 2 tablets together, then 1 every day until finished. 01/17/16   Nelva Nayobert Tresa Jolley, MD    Family History History reviewed. No pertinent family history.  Social History Social History  Substance Use Topics  . Smoking status: Never Smoker  . Smokeless tobacco: Not on file  . Alcohol use No     Allergies   Hydrocodone   Review of Systems Review of Systems  All other systems reviewed and are negative.    Physical Exam Updated Vital Signs BP 126/76 (BP Location: Left Arm)   Pulse 61   Temp 97.7 F (36.5 C) (Oral)   Resp 20   Ht 5\' 9"  (1.753 m)   Wt 225 lb (102.1 kg)   SpO2 98%   BMI 33.23 kg/m   Physical Exam  Constitutional: He is oriented to person, place, and time. He appears well-developed and  well-nourished. No distress.  HENT:  Head: Normocephalic and atraumatic.  Eyes: Pupils are equal, round, and reactive to light.  Neck: Normal range of motion.  Cardiovascular: Normal rate and intact distal pulses.   Pulmonary/Chest: Effort normal. No respiratory distress. He has wheezes.  Abdominal: Soft. Normal appearance and bowel sounds are normal. He exhibits no distension.  Musculoskeletal: Normal range of motion.  Neurological: He is alert and oriented to person, place, and time. No cranial nerve deficit.  Skin: Skin is warm and dry. No rash noted.  Psychiatric: He has a normal mood and affect. His behavior is normal.  Nursing note and vitals reviewed.    ED Treatments / Results  Labs (all labs ordered are listed, but only abnormal results are displayed) Labs Reviewed - No data to display  EKG  EKG Interpretation None       Radiology Dg Chest 2 View  Result Date: 01/17/2016 CLINICAL DATA:  Cough, wheezing for 1 week.  Fever off and on. EXAM: CHEST  2 VIEW COMPARISON:  07/19/2015 FINDINGS: Heart and mediastinal contours are within normal limits. No focal opacities or effusions. No acute bony abnormality. IMPRESSION: No active cardiopulmonary disease. Electronically Signed   By: Charlett NoseKevin  Dover M.D.   On: 01/17/2016 12:21    Procedures Procedures (  including critical care time)  Medications Ordered in ED Medications  dexamethasone (DECADRON) injection 10 mg (not administered)  albuterol (PROVENTIL HFA;VENTOLIN HFA) 108 (90 Base) MCG/ACT inhaler 2 puff (not administered)     Initial Impression / Assessment and Plan / ED Course  I have reviewed the triage vital signs and the nursing notes.  Pertinent labs & imaging results that were available during my care of the patient were reviewed by me and considered in my medical decision making (see chart for details).  Clinical Course      Final Clinical Impressions(s) / ED Diagnoses   Final diagnoses:  Acute  bronchitis, unspecified organism    New Prescriptions New Prescriptions   AZITHROMYCIN (ZITHROMAX) 250 MG TABLET    Take 1 tablet (250 mg total) by mouth daily. Take first 2 tablets together, then 1 every day until finished.     Nelva Nay, MD 01/17/16 [phone]

## 2016-02-21 ENCOUNTER — Ambulatory Visit (INDEPENDENT_AMBULATORY_CARE_PROVIDER_SITE_OTHER): Payer: No Typology Code available for payment source | Admitting: Neurology

## 2016-02-21 ENCOUNTER — Encounter: Payer: Self-pay | Admitting: Neurology

## 2016-02-21 VITALS — BP 138/78 | HR 72 | Resp 18 | Ht 69.0 in | Wt 240.0 lb

## 2016-02-21 DIAGNOSIS — R0681 Apnea, not elsewhere classified: Secondary | ICD-10-CM

## 2016-02-21 DIAGNOSIS — E669 Obesity, unspecified: Secondary | ICD-10-CM | POA: Diagnosis not present

## 2016-02-21 DIAGNOSIS — R0683 Snoring: Secondary | ICD-10-CM

## 2016-02-21 DIAGNOSIS — R400 Somnolence: Secondary | ICD-10-CM | POA: Diagnosis not present

## 2016-02-21 DIAGNOSIS — G2581 Restless legs syndrome: Secondary | ICD-10-CM

## 2016-02-21 DIAGNOSIS — R351 Nocturia: Secondary | ICD-10-CM

## 2016-02-21 NOTE — Patient Instructions (Signed)

## 2016-02-21 NOTE — Progress Notes (Signed)
Subjective:    Patient ID: Austin PlaterRonnie L Campbell is a 60 y.o. male.  HPI     Huston FoleySaima Enzo Treu, MD, PhD [facility] [address], [address] [address] [place], [place] [address]  Dear Dr. Judie GrieveBryan,   I saw your patient, Austin PearlRonnie Campbell, upon your kind request in my neurologic clinic today for initial consultation of his sleep disorder, in particular, concern for obstructive sleep apnea. The patient is unaccompanied today. As you know, Mr. [name] is a 60 year old right-handed gentleman with an underlying medical history of hyperlipidemia, iron deficiency anemia, and obesity, who reports snoring, excessive daytime somnolence and witnessed apneic pauses while asleep per wife's report. [facility] along with his [facility]. His Epworth sleepiness score is 19 out of 24 today, his fatigue score is 33/63. He takes flexeril generic as needed. He is on disability for knee injuries. 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 He has woken up with a sense of gasping sensation, does endorse occasional RLS symptoms and has kicked in his sleep.  He does not endorse morning HAs, and has nocturia 1-2 per night on average.   He does not smoke or drink alcohol. He drinks caffeine occasionally, occasional energy drink, not necessarily a daily caffeine consumer. He has stopped drinking sodas. Bedtime is on the late side, between 1 and 2 AM on average, wakeup time between 6 and 7. He does not typically wake up rested. His mother had obstructive sleep apnea. She is deceased. He lives at home with his wife. He has 4 children.  His Past Medical History Is Significant For: Past Medical History:  Diagnosis Date  . Iron deficiency anemia, unspecified   . Other and unspecified hyperlipidemia     His Past Surgical History Is Significant For: Past Surgical History:  Procedure Laterality Date  . HERNIA REPAIR    . KNEE SURGERY     right knee    His  Family History Is Significant For: Family History  Problem Relation Age of Onset  . Rheum arthritis Mother   . Diabetes Mother   . Cancer Mother     His Social History Is Significant For: Social History   Social History  . Marital status: Married    Spouse name: N/A  . Number of children: 4  . Years of education: BA   Occupational History  . Driver     Social History Main Topics  . Smoking status: Never Smoker  . Smokeless tobacco: Never Used  . Alcohol use No  . Drug use: No  . Sexual activity: Not Asked   Other Topics Concern  . None   Social History Narrative   Drinks about 1 cup of coffee a day, will drink energy drink occasionally     His Allergies Are:  Allergies  Allergen Reactions  . Hydrocodone Other (See Comments)    Dizziness, Hallucination, nervous  :   His Current Medications Are:  Outpatient Encounter Prescriptions as of 02/21/2016  Medication Sig  . predniSONE (DELTASONE) 20 MG tablet TK 1 T PO BID  . [DISCONTINUED] azithromycin (ZITHROMAX) 250 MG tablet Take 1 tablet (250 mg total) by mouth daily. Take first 2 tablets together, then 1 every day until finished.  . [DISCONTINUED] ibuprofen (ADVIL,MOTRIN) 200 MG tablet Take 400 mg by mouth every 6 (six) hours as needed.   No facility-administered encounter medications on file as of 02/21/2016.   :  Review of Systems:  Out of a complete 14 point review of systems, all are reviewed and negative with the exception of these symptoms as listed below: Review of Systems  Constitutional: Positive for fatigue.  Neurological:       Patient has trouble falling and staying asleep, snoring, witnessed apnea, wakes up feeling tired, daytime fatigue, sometimes takes naps.    Epworth Sleepiness Scale 0= would never doze 1= slight chance of dozing 2= moderate chance of dozing 3= high chance of dozing  Sitting and reading:3 Watching TV:3 Sitting inactive in a public place (ex. Theater or meeting):3 As a  passenger in a car for an hour without a break:2 Lying down to rest in the afternoon:2 Sitting and talking to someone:2 Sitting quietly after lunch (no alcohol):2 In a car, while stopped in traffic:2 Total:19  Objective:  Neurologic Exam  Physical Exam Physical Examination:   Vitals:   02/21/16 1117  BP: 138/78  Pulse: 72  Resp: 18    General Examination: The patient is a very pleasant 60 y.o. male in no acute distress. He appears well-developed and well-nourished and well groomed.   HEENT: Normocephalic, atraumatic, pupils are equal, round and reactive to light and accommodation. Funduscopic exam is normal with sharp disc margins noted. Extraocular tracking is good without limitation to gaze excursion or nystagmus noted. Normal smooth pursuit is noted. Hearing is grossly intact. Tympanic membranes are clear bilaterally. Face is symmetric with normal facial animation and normal facial sensation. Speech is clear with no dysarthria noted. There is no hypophonia. There is no lip, neck/head, jaw or voice tremor. Neck is supple with full range of passive and active motion. There are no carotid bruits on auscultation. Oropharynx exam reveals: mild mouth dryness, adequate dental hygiene and marked airway crowding, due to large tongue and redundant soft palate. Mallampati is class III. Tongue protrudes centrally and palate elevates symmetrically. Tonsils are not fully visualized. Neck size is 20 inches. He has a Mild overbite. Nasal inspection reveals no significant nasal mucosal bogginess or redness and no septal deviation.   Chest: Clear to auscultation without wheezing, rhonchi or crackles noted.  Heart: S1+S2+0, regular and normal without murmurs, rubs or gallops noted.   Abdomen: Soft, non-tender and non-distended with normal bowel sounds appreciated on auscultation.  Extremities: There is no pitting edema in the distal lower extremities bilaterally. Pedal pulses are intact.  Skin: Warm  and dry without trophic changes noted. There are no varicose veins.  Musculoskeletal: exam reveals no obvious joint deformities, tenderness or joint swelling or erythema.   Neurologically:  Mental status: The patient is awake, alert and oriented in all 4 spheres. His immediate and remote memory, attention, language skills and fund of knowledge are appropriate. There is no evidence of aphasia, agnosia, apraxia or anomia. Speech is clear with normal prosody and enunciation. Thought process is linear. Mood is normal and affect is normal.  Cranial nerves II - XII are as described above under HEENT exam. In addition: shoulder shrug is normal with equal shoulder height noted. Motor exam: Normal bulk, strength and tone is noted. There is no drift, tremor or rebound. Romberg is negative. Reflexes are 2+ throughout in the upper extremities, 1+ in the left knee and absent in the right knee. Fine motor skills and coordination: intact with normal finger taps, normal hand movements, normal rapid alternating patting, normal foot taps and normal foot agility.  Cerebellar testing: No dysmetria or intention tremor on finger to nose testing. Heel to shin is unremarkable bilaterally. There  is no truncal or gait ataxia.  Sensory exam: intact to light touch, pinprick, vibration, temperature sense in the upper and lower extremities.  Gait, station and balance: He stands easily. No veering to one side is noted. No leaning to one side is noted. Posture is age-appropriate and stance is narrow based. Gait shows normal stride length and normal pace. No problems turning are noted. Tandem walk is slightly difficult for him.  Assessment and plan:  In summary, [name] is a very pleasant 60 y.o.-year old male with an underlying medical history of hyperlipidemia, iron deficiency anemia, history of knee injuries, status post right knee arthroscopic surgery in 2011, and obesity, whose history and physical exam are in keeping  with obstructive sleep apnea (OSA). In addition, he endorses RLS symptoms.  I had a long chat with the patient about my findings and the diagnosis of OSA, its prognosis and treatment options. We talked about medical treatments, surgical interventions and non-pharmacological approaches. I explained in particular the risks and ramifications of untreated moderate to severe OSA, especially with respect to developing cardiovascular disease down the Road, including congestive heart failure, difficult to treat hypertension, cardiac arrhythmias, or stroke. Even type 2 diabetes has, in part, been linked to untreated OSA. Symptoms of untreated OSA include daytime sleepiness, memory problems, mood irritability and mood disorder such as depression and anxiety, lack of energy, as well as recurrent headaches, especially morning headaches. We talked about trying to maintain a healthy lifestyle in general, as well as the importance of weight control. I encouraged the patient to eat healthy, exercise daily and keep well hydrated, to keep a scheduled bedtime and wake time routine, to not skip any meals and eat healthy snacks in between meals. I advised the patient not to drive when feeling sleepy. I recommended the following at this time: sleep study with potential positive airway pressure titration. (We will score hypopneas at 4% and split the sleep study into diagnostic and treatment portion, if the estimated. 2 hour AHI is >15/h).   I explained the sleep test procedure to the patient and also outlined possible surgical and non-surgical treatment options of OSA, including the use of a custom-made dental device (which would require a referral to a specialist dentist or oral surgeon), upper airway surgical options, such as pillar implants, radiofrequency surgery, tongue base surgery, and UPPP (which would involve a referral to an ENT surgeon). Rarely, jaw surgery such as mandibular advancement may be considered.  I also  explained the CPAP treatment option to the patient, who indicated that he would be willing to try CPAP if the need arises. I explained the importance of being compliant with PAP treatment, not only for insurance purposes but primarily to improve His symptoms, and for the patient's long term health benefit, including to reduce His cardiovascular risks. I answered all his questions today and the patient was in agreement. I would like to see him back after the sleep study is completed and encouraged him to call with any interim questions, concerns, problems or updates.   Thank you very much for allowing me to participate in the care of this nice patient. If I can be of any further assistance to you please do not hesitate to call me at [phone].  Sincerely,   Huston FoleySaima Barney Russomanno, MD, PhD

## 2016-09-19 ENCOUNTER — Emergency Department (HOSPITAL_COMMUNITY)
Admission: EM | Admit: 2016-09-19 | Discharge: 2016-09-19 | Disposition: A | Discharge status: Home / Self Care | Payer: Non-veteran care | Attending: Emergency Medicine | Admitting: Emergency Medicine

## 2016-09-19 ENCOUNTER — Encounter (HOSPITAL_COMMUNITY): Payer: Self-pay | Admitting: Emergency Medicine

## 2016-09-19 ENCOUNTER — Emergency Department (HOSPITAL_COMMUNITY): Payer: Non-veteran care

## 2016-09-19 DIAGNOSIS — J40 Bronchitis, not specified as acute or chronic: Secondary | ICD-10-CM | POA: Insufficient documentation

## 2016-09-19 DIAGNOSIS — R059 Cough, unspecified: Secondary | ICD-10-CM

## 2016-09-19 DIAGNOSIS — R05 Cough: Secondary | ICD-10-CM

## 2016-09-19 DIAGNOSIS — R062 Wheezing: Secondary | ICD-10-CM | POA: Diagnosis present

## 2016-09-19 HISTORY — DX: Unspecified asthma, uncomplicated: J45.909

## 2016-09-19 MED ORDER — ALBUTEROL (5 MG/ML) CONTINUOUS INHALATION SOLN
10.0000 mg/h | INHALATION_SOLUTION | RESPIRATORY_TRACT | Status: DC
  Filled 2016-09-19: qty 20

## 2016-09-19 MED ORDER — PREDNISONE 20 MG PO TABS: ORAL_TABLET | ORAL | 0 refills | Status: DC

## 2016-09-19 MED ORDER — IPRATROPIUM BROMIDE 0.02 % IN SOLN
1.0000 mg | Freq: Once | RESPIRATORY_TRACT | Status: AC
  Administered 2016-09-19: 1 mg via RESPIRATORY_TRACT
  Filled 2016-09-19: qty 5

## 2016-09-19 MED ORDER — PREDNISONE 50 MG PO TABS
60.0000 mg | ORAL_TABLET | Freq: Once | ORAL | Status: AC
  Administered 2016-09-19: 60 mg via ORAL
  Filled 2016-09-19: qty 1

## 2016-09-19 NOTE — ED Notes (Signed)
Rt aware of tx 

## 2016-09-19 NOTE — ED Provider Notes (Signed)
[facility] Provider Note   CSN: [id number] Arrival date & time: 09/19/16  1206     History   Chief Complaint Chief Complaint  Patient presents with  . Wheezing  . Cough    HPI [name] is a 61 y.o. male.   Wheezing   This is a recurrent problem. The current episode started more than 1 week ago. The problem occurs constantly. The problem has not changed since onset.Associated symptoms include coryza and cough. He has tried nothing for the symptoms.  Cough  Associated symptoms include wheezing.    Past Medical History:  Diagnosis Date  . Asthma   . Iron deficiency anemia, unspecified   . Other and unspecified hyperlipidemia     Patient Active Problem List   Diagnosis Date Noted  . Chest pain 09/10/2012  . HYPERLIPIDEMIA 06/14/2007  . MALAISE AND FATIGUE 06/14/2007  . KNEE SPRAIN, RIGHT 06/14/2007    Past Surgical History:  Procedure Laterality Date  . HERNIA REPAIR    . KNEE SURGERY     right knee       Home Medications    Prior to Admission medications   Medication Sig Start Date End Date Taking? Authorizing Provider  predniSONE (DELTASONE) 20 MG tablet 3 tabs po daily x 3 days, then 2 tabs x 3 days, then 1.5 tabs x 3 days, then 1 tab x 3 days, then 0.5 tabs x 3 days 09/19/16   Robbyn Hodkinson, Barbara Cower, MD    Family History Family History  Problem Relation Age of Onset  . Rheum arthritis Mother   . Diabetes Mother   . Cancer Mother     Social History Social History  Substance Use Topics  . Smoking status: Never Smoker  . Smokeless tobacco: Never Used  . Alcohol use No     Allergies   Hydrocodone   Review of Systems Review of Systems  Respiratory: Positive for cough and wheezing.   All other systems reviewed and are negative.    Physical Exam Updated Vital Signs BP 124/67 (BP Location: Left Arm)   Pulse 66   Temp 98.1 F (36.7 C) (Oral)   Resp 20   Ht 5\' 9"  (1.753 m)   Wt 104.3 kg (230 lb)   SpO2 95%   BMI 33.97  kg/m   Physical Exam  Constitutional: He appears well-developed and well-nourished.  HENT:  Head: Normocephalic and atraumatic.  Eyes: Conjunctivae and EOM are normal.  Neck: Normal range of motion.  Cardiovascular: Normal rate.   Pulmonary/Chest: Effort normal. Tachypnea noted. No respiratory distress. He has decreased breath sounds. He has wheezes.  Abdominal: Soft. He exhibits no distension.  Musculoskeletal: Normal range of motion.  Neurological: He is alert.  Skin: Skin is warm and dry.  Nursing note and vitals reviewed.    ED Treatments / Results  Labs (all labs ordered are listed, but only abnormal results are displayed) Labs Reviewed - No data to display  EKG  EKG Interpretation None       Radiology Dg Chest 2 View  Result Date: 09/19/2016 CLINICAL DATA:  61 year old with current history of asthma, presenting with 8 month history of chronic cough and wheezing. Nonsmoker. EXAM: CHEST  2 VIEW COMPARISON:  04/15/2016, 02/13/2016 and earlier, including CT chest 03/08/2016. FINDINGS: Cardiac silhouette normal in size, unchanged. Minimal atherosclerosis involving the aortic arch, unchanged. Hilar and mediastinal contours otherwise unremarkable. Mild central peribronchial thickening, unchanged. No new pulmonary parenchymal abnormalities. No pleural effusions. Degenerative changes and DISH involving  the thoracic spine. IMPRESSION: Stable mild changes of chronic bronchitis and/or asthma. No acute cardiopulmonary disease. Electronically Signed   By: Hulan Saashomas  Lawrence M.D.   On: 09/19/2016 12:44    Procedures Procedures (including critical care time)  Medications Ordered in ED Medications  albuterol (PROVENTIL,VENTOLIN) solution continuous neb (10 mg/hr Nebulization New Bag/Given 09/19/16 1416)  ipratropium (ATROVENT) nebulizer solution 1 mg (1 mg Nebulization Given 09/19/16 1417)  predniSONE (DELTASONE) tablet 60 mg (60 mg Oral Given 09/19/16 1437)     Initial Impression /  Assessment and Plan / ED Course  I have reviewed the triage vital signs and the nursing notes.  Pertinent labs & imaging results that were available during my care of the patient were reviewed by me and considered in my medical decision making (see chart for details).     Likely asthma exacerbation vs bronchitis. Improved here after steroids/albuterol/ipratropium. Ambulates on pulse ox without desaturation or tachypnea and feels much improved.  Will dc on prolonged taper of steroids as this is the 4th or 5th episode of this in last 6 months. [facility] type of [facility]' but patient is not sure what it is. [facility] and request [facility]ology consult there if not feeling better.   Final Clinical Impressions(s) / ED Diagnoses   Final diagnoses:  Cough  Bronchitis    New Prescriptions New Prescriptions   PREDNISONE (DELTASONE) 20 MG TABLET    3 tabs po daily x 3 days, then 2 tabs x 3 days, then 1.5 tabs x 3 days, then 1 tab x 3 days, then 0.5 tabs x 3 days     Marily MemosMesner, Takisha Pelle, MD 09/19/16 [phone]

## 2016-09-19 NOTE — ED Notes (Signed)
Ambulated Pt Pt spo2 was 98 pulse was 66

## 2016-09-19 NOTE — ED Notes (Signed)
Patient stated had chest pain of 6 when he coughed.

## 2016-09-19 NOTE — ED Notes (Signed)
Rt aware of pt

## 2016-09-19 NOTE — ED Notes (Signed)
Patient transported to X-ray 

## 2016-09-19 NOTE — ED Triage Notes (Signed)
Pt reports productive cough with yellow sputum, shortness of breath on exertion, and wheezing for 3 weeks.  No relief with home nebs, but last one used last night.

## 2016-09-19 NOTE — ED Notes (Signed)
Pt receiving tx

## 2017-01-29 ENCOUNTER — Ambulatory Visit (HOSPITAL_COMMUNITY): Payer: Non-veteran care | Attending: Internal Medicine | Admitting: Physical Therapy

## 2017-01-29 DIAGNOSIS — R262 Difficulty in walking, not elsewhere classified: Secondary | ICD-10-CM | POA: Insufficient documentation

## 2017-01-29 DIAGNOSIS — M5441 Lumbago with sciatica, right side: Secondary | ICD-10-CM | POA: Insufficient documentation

## 2017-01-29 DIAGNOSIS — G8929 Other chronic pain: Secondary | ICD-10-CM | POA: Insufficient documentation

## 2017-01-29 DIAGNOSIS — R2689 Other abnormalities of gait and mobility: Secondary | ICD-10-CM | POA: Insufficient documentation

## 2017-01-29 DIAGNOSIS — M25551 Pain in right hip: Secondary | ICD-10-CM | POA: Insufficient documentation

## 2017-01-29 DIAGNOSIS — M25552 Pain in left hip: Secondary | ICD-10-CM | POA: Insufficient documentation

## 2017-01-29 DIAGNOSIS — M5442 Lumbago with sciatica, left side: Secondary | ICD-10-CM | POA: Insufficient documentation

## 2017-02-13 ENCOUNTER — Ambulatory Visit (HOSPITAL_COMMUNITY): Payer: Non-veteran care | Admitting: Physical Therapy

## 2017-02-14 ENCOUNTER — Encounter (HOSPITAL_COMMUNITY): Payer: Self-pay | Admitting: Physical Therapy

## 2017-02-14 ENCOUNTER — Other Ambulatory Visit: Payer: Self-pay

## 2017-02-14 ENCOUNTER — Ambulatory Visit (HOSPITAL_COMMUNITY): Payer: Non-veteran care | Admitting: Physical Therapy

## 2017-02-14 DIAGNOSIS — M25551 Pain in right hip: Secondary | ICD-10-CM

## 2017-02-14 DIAGNOSIS — M5441 Lumbago with sciatica, right side: Secondary | ICD-10-CM | POA: Diagnosis present

## 2017-02-14 DIAGNOSIS — G8929 Other chronic pain: Secondary | ICD-10-CM | POA: Diagnosis present

## 2017-02-14 DIAGNOSIS — M25552 Pain in left hip: Secondary | ICD-10-CM

## 2017-02-14 DIAGNOSIS — R2689 Other abnormalities of gait and mobility: Secondary | ICD-10-CM

## 2017-02-14 DIAGNOSIS — M5442 Lumbago with sciatica, left side: Secondary | ICD-10-CM | POA: Diagnosis present

## 2017-02-14 DIAGNOSIS — R262 Difficulty in walking, not elsewhere classified: Secondary | ICD-10-CM

## 2017-02-14 NOTE — Therapy (Addendum)
[facility] [address] [place], [place], [address] Phone: [phone]   Fax:  [phone]  Physical Therapy Evaluation  Patient Details  Name: [name] MRN: [id number] Date of Birth: 1956/01/25 Referring Provider: Glenford Peers   Encounter Date: 02/14/2017    Past Medical History:  Diagnosis Date  . Asthma   . Iron deficiency anemia, unspecified   . Other and unspecified hyperlipidemia     Past Surgical History:  Procedure Laterality Date  . HERNIA REPAIR    . KNEE SURGERY     right knee    There were no vitals filed for this visit.     [facility] PT Assessment - 02/14/17 0001      Assessment   Medical Diagnosis  Low Back Pain/Hip Pain    Referring Provider  Glenford Peers    Onset Date/Surgical Date  01/26/16 greater than 1 year ago    Next MD Visit  unsure    Prior Therapy  PT for both knees, hips about 6 months ago      Precautions   Precautions  None      Restrictions   Weight Bearing Restrictions  No      Balance Screen   Has the patient fallen in the past 6 months  No    Has the patient had a decrease in activity level because of a fear of falling?   Yes    Is the patient reluctant to leave their home because of a fear of falling?   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, dispensing and he can' tlift luggage from bus cargo  hold or sit for long time to drive (1-0+ hours), he is currently on disabilty now loading unloading luhgge (disability)     Leisure  [employer], rides motorcycle and road bike but cannot tolerate more than 30 minutes which still increases his pain       Cognition   Overall Cognitive Status  Within Functional Limits for tasks assessed      Sensation   Additional Comments  Patient reports some tingling down left lateral thigh and leg (intermitten)      Functional Tests   Functional tests  Squat;Single leg stance      Squat   Comments  Pateint with decreased depth, decreased hip flexion, forward head, early heel rise increased hip pain bilaterally      Single Leg Stance   Comments  RLE = 15 seconds; LLE = 13 seconds; Patient with low gaurd posture to increase BOS/COM      Posture/Postural Control   Posture/Postural Control  No significant limitations    Posture Comments  pateint with decreased lumbar lordosis in standing and sitting      AROM   Overall  AROM Comments  BLE knee/ankle ROM WFL    Right Hip Extension  2    Right Hip Flexion  89 hard end feel with overpressure    Right Hip External Rotation   38 painful at end range; hard end feel with overpressure    Right Hip Internal Rotation   40 painful at end range; hard end feel with overpressure    Left Hip Extension  1    Left Hip Flexion  80 hard end feel with overpressure    Left Hip External Rotation   38 painful at end range; hard end feel with overpressure    Left Hip Internal Rotation   36 painful at end range; hard end feel with overpressure    Lumbar Flexion  25% limited relieves hip pain    Lumbar Extension  75% limited relieves hip pain    Lumbar - Right Side Bend  25% limited relieves hip pain    Lumbar - Left Side Bend  25% limited relieves hip pain    Lumbar - Right Rotation  25% limited relieves hip pain    Lumbar - Left Rotation  25% limited  relieves hip pain      Strength   Right Hip Flexion  4+/5     Right Hip Extension  4-/5    Right Hip External Rotation   4-/5 limited testing by pain    Right Hip Internal Rotation  4-/5 limited testing by pain    Right Hip ABduction  4-/5 patient compensating with TFL    Left Hip Flexion  4+/5    Left Hip Extension  4-/5    Left Hip External Rotation  4-/5 limited testing by pain    Left Hip Internal Rotation  4-/5 limited testing by pain    Left Hip ABduction  4-/5 patient compensating with TFL    Right Knee Flexion  5/5    Right Knee Extension  5/5    Left Knee Flexion  5/5    Left Knee Extension  5/5      Flexibility   Soft Tissue Assessment /Muscle Length  yes    Hamstrings  moderate limitation BLE      Special Tests    Special Tests  Sacrolliac Tests;Hip Special Tests;Lumbar    Lumbar Tests  Slump Test;FABER test;Straight Leg Raise    Sacroiliac Tests   Sacral Compression    Hip Special Tests   Other;Hip Scouring      FABER test   findings  Positive    Comment  BLE, hard end feel      Slump test   Findings  Positive    Comment  positive with dorsiflexion and relieved by cervical extension BLE      Straight Leg Raise   Findings  Negative    Comment  hamstring limitation      Pelvic Dictraction   Findings  Negative      Pelvic Compression   Findings  Negative      Sacral thrust    Findings  Negative      Gaenslen's test   Findings  Positive    Comments  provocked hip pain BLE      Sacral Compression   Findings  Negative      Hip Scouring   Findings  Positive    Comments  posteralateral hip pan BLE      other   Findings  Positive    Comments  FADDIR: BLE; patin and limited mobility  Ambulation/Gait   Ambulation Distance (Feet)  540 Feet    Gait Pattern  Step-through pattern;Decreased step length - left;Decreased stride length;Decreased hip/knee flexion - right;Decreased hip/knee flexion - left;Antalgic;Trendelenburg;Wide base of support;Lateral trunk lean to right    Ambulation Surface  Level    Gait  velocity  0.91 m/s      6 minute walk test results    Aerobic Endurance Distance Walked  540    Endurance additional comments  pain began increasing at 2 minutes      Objective measurements completed on examination: See above findings.           PT Short Term Goals - 02/14/17 1135      PT SHORT TERM GOAL #1   Title  Patient will be compliant with HEP (participating daily) and be able to demonstrate proper form for exercises    Time  2    Period  Weeks    Status  New    Target Date  02/28/17      PT SHORT TERM GOAL #2   Title  Patient will have increased hip ROM in BLE for flexion to 105*, extension to 8*, IR to 40* , and ER to 45* for improved functional movement    Time  3    Period  Weeks    Status  New    Target Date  03/07/17      PT SHORT TERM GOAL #3   Title  Patient will be able to perform 3MWT without onset of pain and increase his distance by 100' to demonstrate improved activity tolerance    Time  3    Period  Weeks    Status  New      PT SHORT TERM GOAL #4   Title  Patient will improve SLS on BLE to 30 seconds to demonstrate improved balance for functional mobility with ascending/descending stairs    Time  3    Period  Weeks    Status  New      PT SHORT TERM GOAL #5   Title  Patient will improve MMT byt at least 1/2 grade with all muscle groups tested to demonstrate improved functional strength    Time  3    Period  Weeks    Status  New        PT Long Term Goals - 02/14/17 1145      PT LONG TERM GOAL #1   Title  Patient will have increased hip ROM in BLE for flexion to 115*, extension to 10*, IR to 40* , and ER to 45* for improved functional movement    Time  6    Period  Weeks    Status  New    Target Date  03/28/17      PT LONG TERM GOAL #2   Title  Patient will have demosntrate normal posture during functional squat with increased depth, no early heel rise, incresaed hip flexion to improve functional llifting mechanics    Time  6     Period  Weeks    Status  New      PT LONG TERM GOAL #3   Title  Patient will be able to perform 6MWT without onset of pain with gait velocity of  >/= 1.4541m/s to demonstrate improved activity tolerance and safe community ambulation    Time  6    Period  Weeks    Status  New      PT LONG TERM GOAL #4  Title  Patient will improve MMT byt at least 1 grade with all muscle groups tested to demonstrate improved functional strength    Time  6    Period  Weeks    Status  New      PT LONG TERM GOAL #5   Title  Patient will be participating in regular (3-4/week) walking program independently or at gym to be able to return to leisure activities such as walking his dog    Time  6    Period  Weeks    Status  New          Patient will benefit from skilled therapeutic intervention in order to improve the following deficits and impairments:  Abnormal gait, Decreased endurance, Hypomobility, Increased fascial restricitons, Decreased strength, Decreased activity tolerance, Decreased mobility, Difficulty walking, Improper body mechanics, Decreased range of motion, Impaired flexibility, Postural dysfunction  Visit Diagnosis: Chronic bilateral low back pain with bilateral sciatica - Plan: PT plan of care cert/re-cert  Pain in left hip - Plan: PT plan of care cert/re-cert  Pain in right hip - Plan: PT plan of care cert/re-cert  Difficulty in walking, not elsewhere classified - Plan: PT plan of care cert/re-cert  Other abnormalities of gait and mobility - Plan: PT plan of care cert/re-cert      PT G-Codes  Functional Assessment Tool Used (Outpatient Only) FOTO, objective testing, clinical judgement  Functional Limitation Mobility: Walking and moving around  Mobility: Walking and Moving Around Current Status ([id number]) At least 20 percent but less than 40 percent impaired, limited or restricted  Mobility: Walking and Moving Around Goal Status [id number]([id number]) At least 1 percent but less than 20 percent  impaired, limited or restricted          Problem List Patient Active Problem List   Diagnosis Date Noted  . Chest pain 09/10/2012  . HYPERLIPIDEMIA 06/14/2007  . MALAISE AND FATIGUE 06/14/2007  . KNEE SPRAIN, RIGHT 06/14/2007     Glyn Adeachel Quinn, PT, DPT Physical Therapist with [facility]  03/08/2017 6:45 PM    [facility] [address] [place], [place], [address] Phone: [phone]   Fax:  [phone]  Name: [name] MRN: [id number] Date of Birth: [birth year]

## 2017-02-20 ENCOUNTER — Telehealth (HOSPITAL_COMMUNITY): Payer: Self-pay | Admitting: Internal Medicine

## 2017-02-20 ENCOUNTER — Ambulatory Visit (HOSPITAL_COMMUNITY): Payer: Non-veteran care

## 2017-02-20 NOTE — Telephone Encounter (Signed)
02/20/17  pt wasn't able to fly out of [place] and wanted to RS -- now scheduled for 11/28

## 2017-02-21 ENCOUNTER — Other Ambulatory Visit: Payer: Self-pay

## 2017-02-21 ENCOUNTER — Ambulatory Visit (HOSPITAL_COMMUNITY): Payer: Non-veteran care

## 2017-02-21 ENCOUNTER — Encounter (HOSPITAL_COMMUNITY): Payer: Self-pay

## 2017-02-21 DIAGNOSIS — M5442 Lumbago with sciatica, left side: Secondary | ICD-10-CM | POA: Diagnosis not present

## 2017-02-21 DIAGNOSIS — M25552 Pain in left hip: Secondary | ICD-10-CM

## 2017-02-21 DIAGNOSIS — M5441 Lumbago with sciatica, right side: Principal | ICD-10-CM

## 2017-02-21 DIAGNOSIS — M25551 Pain in right hip: Secondary | ICD-10-CM

## 2017-02-21 DIAGNOSIS — G8929 Other chronic pain: Secondary | ICD-10-CM

## 2017-02-21 DIAGNOSIS — R262 Difficulty in walking, not elsewhere classified: Secondary | ICD-10-CM

## 2017-02-21 DIAGNOSIS — R2689 Other abnormalities of gait and mobility: Secondary | ICD-10-CM

## 2017-02-21 NOTE — Therapy (Signed)
Ask how LLE feels from nerve glide And R hip from [facility] [address] [place], [place], [address] Phone: [phone]   Fax:  [phone]  Physical Therapy Treatment  Patient Details  Name: Austin PlaterRonnie L Mauriello MRN: [id number] Date of Birth: Dec 09, 1955 Referring Provider: Glenford PeersKenneth Goldberg   Encounter Date: 02/21/2017  PT End of Session - 02/21/17 1033    Visit Number  2    Number of Visits  14    Date for PT Re-Evaluation  03/14/17    Authorization Type  [employer]    Authorization Time Period  02/14/17-03/28/16    Authorization - Visit Number  2    Authorization - Number of Visits  14    PT Start Time  1034    PT Stop Time  1119    PT Time Calculation (min)  45 min    Activity Tolerance  Patient tolerated treatment well;No increased pain    Behavior During Therapy  WFL for tasks assessed/performed       Past Medical History:  Diagnosis Date  . Asthma   . Iron deficiency anemia, unspecified   . Other and unspecified hyperlipidemia     Past Surgical History:  Procedure Laterality Date  . HERNIA REPAIR    . KNEE SURGERY     right knee    There were no vitals filed for this visit.  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 He reports he stopped abuot 2.5 hours through drive and stretched a little (back extension) and this helped. [place] and [place] to [place] tomorrow for [place] and will continue to do his stretches at stops.    Pertinent History  Patient has had R TKA, and has had R/L knee pain, he also uses an inhaler to manage chroic asthma and bronchitis.     Patient Stated Goals  Be able to move and lift things agian around the house without pain.    Currently in Pain?  Yes    Pain Score  3     Pain Location  Back    Pain Orientation  Right;Left;Lower    Pain Descriptors / Indicators  Aching    Pain Type  Chronic pain    Pain Onset  More than a month ago    Pain Frequency  Intermittent    Aggravating Factors   walking and sitting a long time    Pain Relieving Factors  massage machine    Effect of Pain on Daily Activities  limits him at work severly    Multiple Pain Sites  No       [facility] Adult PT Treatment/Exercise - 02/21/17 0001      Lumbar Exercises: Stretches   Passive Hamstring Stretch  2 reps;30 seconds;Limitations    Passive Hamstring Stretch Limitations  BLE      Lumbar Exercises: Standing   Forward Lunge  10 reps;Limitations    Forward Lunge Limitations  6 inch box, BLE      Lumbar Exercises: Seated   Other Seated Lumbar Exercises  Sciatic nerve flossing, LLE, 10 repetitions      Lumbar Exercises: Sidelying   Clam  10 reps;3 seconds;Limitations    Clam Limitations  red theraband    Other Sidelying Lumbar Exercises  reverse clamshell for hip IR, 2x 10 repes with red theraband      Manual Therapy   Manual Therapy  Joint mobilization;Passive ROM    Manual therapy comments  performed seperate from other services    Joint Mobilization  lateral glide with IR on R hip, latearl glide with neutral rotation hip flexed, infereior glide at pain free range for hip flexion; 3x 45 seconds    Passive ROM  RLE hip flexion, ER, IR fbetween mobilization       PT Education - 02/21/17 1122    Education provided  Yes    Education Details  Educated on proper form for exercisse and updated HEP, discussed delayed onset of soreness with exercises today and requested patient keep track of cahgnes in symptoms to discuss at next appointment.    Person(s) Educated  Patient    Methods  Explanation;Handout    Comprehension  Verbalized understanding;Returned demonstration       PT Short Term Goals - 02/14/17 1135      PT SHORT TERM GOAL #1   Title  Patient will be compliant with HEP (participating daily)  and be able to demonstrate proper form for exercises    Time  2    Period  Weeks    Status  New    Target Date  02/28/17      PT SHORT TERM GOAL #2   Title  Patient will have increased hip ROM in BLE for flexion to 105*, extension to 8*, IR to 40* , and ER to 45* for improved functional movement    Time  3    Period  Weeks    Status  New    Target Date  03/07/17      PT SHORT TERM GOAL #3   Title  Patient will be able to perform 3MWT without onset of pain and increase his distance by 100' to demonstrate improved activity tolerance    Time  3    Period  Weeks    Status  New      PT SHORT TERM GOAL #4   Title  Patient will improve SLS on BLE to 30 seconds to demonstrate improved balance for functional mobility with ascending/descending stairs    Time  3    Period  Weeks    Status  New      PT SHORT TERM GOAL #5   Title  Patient will improve MMT byt at least 1/2 grade with all muscle groups tested to demonstrate improved functional strength    Time  3    Period  Weeks    Status  New        PT Long Term Goals - 02/14/17 1145      PT LONG TERM GOAL #1   Title  Patient will have increased hip ROM in BLE for flexion to 115*, extension to 10*, IR to 40* , and ER to 45* for improved functional movement    Time  6    Period  Weeks    Status  New    Target Date  03/28/17      PT LONG TERM GOAL #2   Title  Patient will have demosntrate normal posture during functional squat with increased depth, no early heel rise, incresaed hip flexion to improve functional llifting mechanics    Time  6    Period  Weeks    Status  New      PT LONG TERM GOAL #3   Title  Patient will be able to perform without onset of pain with gait velocity of  >/= 1.55m/s to demonstrate improved activity tolerance and safe community ambulation    Time  6    Period  Weeks    Status  New      PT LONG TERM GOAL #4   Title  Patient will improve MMT byt at least 1 grade with all muscle groups tested to  demonstrate improved functional strength    Time  6    Period  Weeks    Status  New      PT LONG TERM GOAL #5   Title  Patient will be participating in regular (3-4/week) walking program independently or at gym to be able to return to leisure activities such as walking his dog    Time  6    Period  Weeks    Status  New        Plan - 02/21/17 1125    Clinical Impression Statement  Patient initiated treatment today and was provided with HEP for hip strengthening. 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 He responded well to joint mobilizations and nerve glide with no increase in pain. Each was performed on 1 LE to assess patient response to treatment. He continues to be limited by bilatearl hip hypomobility and will continue to benefit from skilled PT to address current impairments.    Rehab Potential  Good    Clinical Impairments Affecting Rehab Potential  patient is highly motivated, has good suppoprt from family, has had positive experience with PT prior to this episode    PT Frequency  2x / week    PT Duration  6 weeks    PT Treatment/Interventions  Balance training;ADLs/Self Care Home Management;Cryotherapy;Ultrasound;DME Instruction;Gait training;Electrical Stimulation;Moist Heat;Stair training;Functional mobility training;Therapeutic activities;Therapeutic exercise;Manual techniques;Patient/family education;Neuromuscular re-education;Passive range of motion;Taping    PT Next Visit Plan  Assess patient response to nerve glides on LLE and joint mobs on R hip. Continue with manual therapy for BLE hip joint if no increase in pain, continue hamstring stretch, functional strengthening. Add sciatic nerve glide to HEP if positive response.    PT Home Exercise Plan  Eval: repeated lumbar extension, 11/28 - hamstring stretch, clamshell, reverse clamshell;     Consulted and Agree with Plan of Care  Patient       Patient will  benefit from skilled therapeutic intervention in order to improve the following deficits and impairments:  Abnormal gait, Decreased endurance, Hypomobility, Increased fascial restricitons, Decreased strength, Decreased activity tolerance, Decreased mobility, Difficulty walking, Improper body mechanics, Decreased range of motion, Impaired flexibility, Postural dysfunction  Visit Diagnosis: Chronic bilateral low back pain with bilateral sciatica  Pain in left hip  Pain in right hip  Difficulty in walking, not elsewhere classified  Other abnormalities of gait and mobility     Problem List Patient Active Problem List   Diagnosis Date Noted  . Chest pain 09/10/2012  . HYPERLIPIDEMIA 06/14/2007  . MALAISE AND FATIGUE 06/14/2007  . KNEE SPRAIN, RIGHT 06/14/2007    Glyn Ade, PT, DPT Physical Therapist with [facility]  02/21/2017 11:40 AM    [facility] [address] [place], [place], [address] Phone: [phone]   Fax:  [phone]  Name: SABAS FRETT MRN: [id number] Date of Birth: [birth year]

## 2017-02-21 NOTE — Patient Instructions (Addendum)
   HAMSTRING STRETCH WITH TOWEL: 1-2 times per day, 2 repetitions with 30 second hold  While lying down on your back, hook a towel or strap under  your foot and draw up your leg until a stretch is felt under your leg. calf area.  Keep your knee in a straightened position during the stretch.      Side lying hip internal rotation: 1-2 time per day, 2 sets x 10 repetitions  (pillow between your knees) + band around ankles - Hold for 1-2 seconds and control eccentric      ELASTIC BAND - SIDELYING CLAM - CLAMSHELL: 1-2 time per day, 2 sets x 10 repetitions   While lying on your side with your knees bent and an elastic band wrapped around your knees, draw up the top knee while keeping contact of your feet together as shown.   Do not let your pelvis roll back during the lifting movement.

## 2017-02-22 ENCOUNTER — Telehealth (HOSPITAL_COMMUNITY): Payer: Self-pay

## 2017-02-22 ENCOUNTER — Ambulatory Visit (HOSPITAL_COMMUNITY): Payer: Non-veteran care

## 2017-02-22 NOTE — Telephone Encounter (Signed)
He is traveling back home from work and can not get here in time for apptment

## 2017-02-27 ENCOUNTER — Ambulatory Visit (HOSPITAL_COMMUNITY): Payer: Non-veteran care | Attending: Internal Medicine | Admitting: Physical Therapy

## 2017-02-27 DIAGNOSIS — M25551 Pain in right hip: Secondary | ICD-10-CM | POA: Insufficient documentation

## 2017-02-27 DIAGNOSIS — M5441 Lumbago with sciatica, right side: Secondary | ICD-10-CM | POA: Insufficient documentation

## 2017-02-27 DIAGNOSIS — G8929 Other chronic pain: Secondary | ICD-10-CM | POA: Insufficient documentation

## 2017-02-27 DIAGNOSIS — M25552 Pain in left hip: Secondary | ICD-10-CM | POA: Insufficient documentation

## 2017-02-27 DIAGNOSIS — R2689 Other abnormalities of gait and mobility: Secondary | ICD-10-CM | POA: Insufficient documentation

## 2017-02-27 DIAGNOSIS — R262 Difficulty in walking, not elsewhere classified: Secondary | ICD-10-CM | POA: Insufficient documentation

## 2017-02-27 DIAGNOSIS — M5442 Lumbago with sciatica, left side: Secondary | ICD-10-CM | POA: Insufficient documentation

## 2017-03-01 ENCOUNTER — Telehealth (HOSPITAL_COMMUNITY): Payer: Self-pay

## 2017-03-01 ENCOUNTER — Ambulatory Visit (HOSPITAL_COMMUNITY): Payer: Non-veteran care

## 2017-03-01 NOTE — Telephone Encounter (Signed)
No Show #1. I called and spoke with [name] directly at his mobile phone number on record. He stated he has been in significant pain for the last 2 days and said "I can't walk enough to come in today". I asked the patient if he has had and bowel/bladder changes and he denies this at this time. I urged the patient to seek emergency care as this is a new and severe change in patient's function. I also urged him to speak to his PCP and alert them to this change. The patient stated he was planning to seek emergency care today. He is aware his next appointment is Monday 12/10 at 9:45 AM and will keep his appointment and call to cancel if things change.  [name], PT, DPT Physical Therapist with [facility]  03/01/2017 11:47 AM

## 2017-03-05 ENCOUNTER — Ambulatory Visit (HOSPITAL_COMMUNITY): Payer: Non-veteran care

## 2017-03-08 ENCOUNTER — Telehealth (HOSPITAL_COMMUNITY): Payer: Self-pay | Admitting: Physical Therapy

## 2017-03-08 ENCOUNTER — Ambulatory Visit (HOSPITAL_COMMUNITY): Payer: Non-veteran care | Admitting: Physical Therapy

## 2017-03-08 NOTE — Telephone Encounter (Signed)
Pt did not show for appointment (NS#2).  Called and spoke to patient who states he was in too much pain to come.  Reminded of NS policy and encouraged to call to cancel if he could not make his appointments.  Pt verbalized understanding.  If patient NS next session, will need to discharge and remove remaining appointments. Lurena NidaAmy B Frazier, PTA/CLT [phone]

## 2017-03-12 ENCOUNTER — Ambulatory Visit (HOSPITAL_COMMUNITY): Payer: Non-veteran care

## 2017-03-12 ENCOUNTER — Other Ambulatory Visit: Payer: Self-pay

## 2017-03-12 ENCOUNTER — Encounter (HOSPITAL_COMMUNITY): Payer: Self-pay

## 2017-03-12 DIAGNOSIS — R262 Difficulty in walking, not elsewhere classified: Secondary | ICD-10-CM | POA: Diagnosis present

## 2017-03-12 DIAGNOSIS — M25551 Pain in right hip: Secondary | ICD-10-CM

## 2017-03-12 DIAGNOSIS — M5442 Lumbago with sciatica, left side: Principal | ICD-10-CM

## 2017-03-12 DIAGNOSIS — G8929 Other chronic pain: Secondary | ICD-10-CM

## 2017-03-12 DIAGNOSIS — M5441 Lumbago with sciatica, right side: Secondary | ICD-10-CM | POA: Diagnosis present

## 2017-03-12 DIAGNOSIS — R2689 Other abnormalities of gait and mobility: Secondary | ICD-10-CM

## 2017-03-12 DIAGNOSIS — M25552 Pain in left hip: Secondary | ICD-10-CM

## 2017-03-12 NOTE — Therapy (Signed)
[facility] [address] [place], [place], [address] Phone: [phone]   Fax:  [phone]  Physical Therapy Treatment  Patient Details  Name: [name] MRN: [id number] Date of Birth: 1955-09-17 Referring Provider: Glenford PeersKenneth Goldberg   Encounter Date: 03/12/2017  PT End of Session - 03/12/17 1230    Visit Number  3    Number of Visits  14    Date for PT Re-Evaluation  03/14/17    Authorization Type  [employer]    Authorization Time Period  02/14/17-03/28/16    Authorization - Visit Number  3    Authorization - Number of Visits  14    PT Start Time  1118    PT Stop Time  1205    PT Time Calculation (min)  47 min    Activity Tolerance  Patient tolerated treatment well;No increased pain    Behavior During Therapy  WFL for tasks assessed/performed       Past Medical History:  Diagnosis Date  . Asthma   . Iron deficiency anemia, unspecified   . Other and unspecified hyperlipidemia     Past Surgical History:  Procedure Laterality Date  . HERNIA REPAIR    . KNEE SURGERY     right knee    There were no vitals filed for this visit.  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 Patient states that he had been sick, but that he has seen his physician and is now feeling better and denied any shortness of breath throughout session. Patient reported that he felt the nerve flossing exercises from the last session helped some.     Pertinent History  Patient has had R TKA, and has had R/L knee pain, he also uses an inhaler to manage chroic asthma and bronchitis.     How long can you sit comfortably?  30 minutes before onset of pain    How long can you stand comfortably?  this usually relieves pain     How long can you walk comfortably?  2 minutes before pain comes on    Patient Stated  Goals  Be able to move and lift things agian around the house without pain.    Currently in Pain?  No/denies    Pain Onset  More than a month ago    Pain Onset  More than a month ago                      [facility] Adult PT Treatment/Exercise - 03/12/17 0001      Ambulation/Gait   Ambulation Distance (Feet)  540 Feet    Gait Pattern  Decreased stride length;Step-through pattern Patient demonstrated decreased heel contact; cued heel strik      Lumbar Exercises: Stretches   Passive Hamstring Stretch  2 reps;30 seconds;Limitations    Passive Hamstring Stretch Limitations  BLE      Lumbar Exercises: Standing   Functional Squats  10 reps Verbal cues to maintain knees behind toes    Forward Lunge  10 reps;Limitations    Forward Lunge Limitations  BLE    Other Standing Lumbar Exercises  Side stepping for hip abduction and cueing to decrease hip drop 10 feet x 3 bilaterally      Lumbar Exercises: Seated   Other Seated Lumbar Exercises  Sciatic  nerve flossing, LLE, 10 repetitions with 3 second holds      Lumbar Exercises: Supine   Other Supine Lumbar Exercises  Lumbar rotation x 10 with 3 second holds      Lumbar Exercises: Sidelying   Clam  10 reps;3 seconds;Limitations    Clam Limitations  red theraband      Knee/Hip Exercises: Standing   Forward Step Up  10 reps;Step Height: 6" Bilateral lower extremities; cueing think of lifting up    Other Standing Knee Exercises  Heel taps on 4'' step with mirror for visual cues to maintain hip level x 10 bilaterally  Left knee patient reported slight discomfort             PT Education - 03/12/17 1226    Education provided  Yes    Education Details  Patient was educated on purpose of performing hip abduction exercises and discussed with patient current HEP and reprinted exercises for patient.     Person(s) Educated  Patient    Methods  Explanation;Handout;Demonstration    Comprehension  Verbalized understanding       PT  Short Term Goals - 02/14/17 1135      PT SHORT TERM GOAL #1   Title  Patient will be compliant with HEP (participating daily) and be able to demonstrate proper form for exercises    Time  2    Period  Weeks    Status  New    Target Date  02/28/17      PT SHORT TERM GOAL #2   Title  Patient will have increased hip ROM in BLE for flexion to 105*, extension to 8*, IR to 40* , and ER to 45* for improved functional movement    Time  3    Period  Weeks    Status  New    Target Date  03/07/17      PT SHORT TERM GOAL #3   Title  Patient will be able to perform without onset of pain and increase his distance by 100' to demonstrate improved activity tolerance    Time  3    Period  Weeks    Status  New      PT SHORT TERM GOAL #4   Title  Patient will improve SLS on BLE to 30 seconds to demonstrate improved balance for functional mobility with ascending/descending stairs    Time  3    Period  Weeks    Status  New      PT SHORT TERM GOAL #5   Title  Patient will improve MMT byt at least 1/2 grade with all muscle groups tested to demonstrate improved functional strength    Time  3    Period  Weeks    Status  New        PT Long Term Goals - 02/14/17 1145      PT LONG TERM GOAL #1   Title  Patient will have increased hip ROM in BLE for flexion to 115*, extension to 10*, IR to 40* , and ER to 45* for improved functional movement    Time  6    Period  Weeks    Status  New    Target Date  03/28/17      PT LONG TERM GOAL #2   Title  Patient will have demosntrate normal posture during functional squat with increased depth, no early heel rise, incresaed hip flexion to improve functional llifting mechanics    Time  6    Period  Weeks    Status  New      PT LONG TERM GOAL #3   Title  Patient will be able to perform 6MWT without onset of pain with gait velocity of  >/= 1.2839m/s to demonstrate improved activity tolerance and safe community ambulation    Time  6    Period  Weeks     Status  New      PT LONG TERM GOAL #4   Title  Patient will improve MMT byt at least 1 grade with all muscle groups tested to demonstrate improved functional strength    Time  6    Period  Weeks    Status  New      PT LONG TERM GOAL #5   Title  Patient will be participating in regular (3-4/week) walking program independently or at gym to be able to return to leisure activities such as walking his dog    Time  6    Period  Weeks    Status  New            Plan - 03/12/17 1227    Clinical Impression Statement  This session discussed with patient how his pain level has been since the last time he attended therapy. 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 Patient stated that he felt the nerve glides helped some. Began session with nerve flossing for the sciatic nerve in sitting. Followed by lower extremity stretching to hamstrings and piriformis in order to improve range of motion. Included gait training this session to assess patient's gait pattern and noted decreased stride length as well as decreased heel contact and rolling through foot. Patient was cued on proper heel strike and push-off. Then progressed with lower extremity strengthening including heel taps to strengthen hip abduction, functional squatting, and hip abduction exercises focusing on maintaining the pelvis in neutral. Patient tolerated exercises well and denied pain or discomfort at the end of the session. Plan to continue with nerve flossing exercises as well as strengthening exercises in future sessions.     Rehab Potential  Good    Clinical Impairments Affecting Rehab Potential  patient is highly motivated, has good suppoprt from family, has had positive experience with PT prior to this episode    PT Frequency  2x / week    PT Duration  6 weeks    PT Treatment/Interventions  Balance training;ADLs/Self Care Home  Management;Cryotherapy;Ultrasound;DME Instruction;Gait training;Electrical Stimulation;Moist Heat;Stair training;Functional mobility training;Therapeutic activities;Therapeutic exercise;Manual techniques;Patient/family education;Neuromuscular re-education;Passive range of motion;Taping    PT Next Visit Plan  Continue with manual therapy for BLE hip joint if no increase in pain, continue hamstring stretch, functional strengthening.     PT Home Exercise Plan  Eval: repeated lumbar extension, 11/28 - hamstring stretch, clamshell, reverse clamshell;     Consulted and Agree with Plan of Care  Patient       Patient will benefit from skilled therapeutic intervention in order to improve the following deficits and impairments:  Abnormal gait, Decreased endurance, Hypomobility, Increased fascial restricitons, Decreased strength, Decreased activity tolerance, Decreased mobility, Difficulty walking, Improper body mechanics, Decreased range of motion, Impaired flexibility, Postural dysfunction  Visit Diagnosis: Chronic bilateral low back pain with bilateral sciatica  Pain in left hip  Pain in right hip  Difficulty in walking, not elsewhere classified  Other abnormalities of gait and mobility  Problem List Patient Active Problem List   Diagnosis Date Noted  . Chest pain 09/10/2012  . HYPERLIPIDEMIA 06/14/2007  . MALAISE AND FATIGUE 06/14/2007  . KNEE SPRAIN, RIGHT 06/14/2007   Verne Carrow PT, DPT 12:34 PM, 03/12/17 [phone]  [facility] [address] [place], [place], [address] Phone: [phone]   Fax:  [phone]  Name: [name] MRN: [id number] Date of Birth: [birth year]

## 2017-03-15 ENCOUNTER — Telehealth (HOSPITAL_COMMUNITY): Payer: Self-pay

## 2017-03-15 ENCOUNTER — Other Ambulatory Visit: Payer: Self-pay

## 2017-03-15 ENCOUNTER — Ambulatory Visit (HOSPITAL_COMMUNITY): Payer: Non-veteran care

## 2017-03-15 ENCOUNTER — Encounter (HOSPITAL_COMMUNITY): Payer: Self-pay

## 2017-03-15 DIAGNOSIS — R2689 Other abnormalities of gait and mobility: Secondary | ICD-10-CM

## 2017-03-15 DIAGNOSIS — M25551 Pain in right hip: Secondary | ICD-10-CM

## 2017-03-15 DIAGNOSIS — M25552 Pain in left hip: Secondary | ICD-10-CM

## 2017-03-15 DIAGNOSIS — M5442 Lumbago with sciatica, left side: Principal | ICD-10-CM

## 2017-03-15 DIAGNOSIS — R262 Difficulty in walking, not elsewhere classified: Secondary | ICD-10-CM

## 2017-03-15 DIAGNOSIS — G8929 Other chronic pain: Secondary | ICD-10-CM

## 2017-03-15 DIAGNOSIS — M5441 Lumbago with sciatica, right side: Principal | ICD-10-CM

## 2017-03-15 NOTE — Addendum Note (Signed)
Addended by: Glyn AdeQUINN, RACHEL on: 03/15/2017 07:19 PM   Modules accepted: Orders

## 2017-03-15 NOTE — Therapy (Addendum)
[place], [place], [address] Phone: [phone]   Fax:  [phone]  Physical Therapy Treatment/Re-Assessment  Patient Details  Name: [name] MRN: [id number] Date of Birth: Sep 23, 1955 Referring Provider: Jorge Mandril   Encounter Date: 03/15/2017  PT End of Session - 03/15/17 1354    Visit Number  4    Number of Visits  14    Date for PT Re-Evaluation  03/14/17    Authorization Type  [employer]    Authorization Time Period  02/14/17-03/28/16    Authorization - Visit Number  4    Authorization - Number of Visits  14    PT Start Time  1325    PT Stop Time  1350    PT Time Calculation (min)  25 min    Activity Tolerance  Patient tolerated treatment well;No increased pain    Behavior During Therapy  WFL for tasks assessed/performed       Past Medical History:  Diagnosis Date  . Asthma   . Iron deficiency anemia, unspecified   . Other and unspecified hyperlipidemia     Past Surgical History:  Procedure Laterality Date  . HERNIA REPAIR    . KNEE SURGERY     right knee    There were no vitals filed for this visit.  Subjective Assessment - 03/15/17 1325    Subjective  Patient went to [facility] hospital in [place] as they thought it might be circulation related or a blood clot and that he would need surgery; however, they have performed testing and cleared him of this concern. He is having a follow up for an MRI of back and legs, left leg primarily. Has not been able to keep up with HEP. He ambulated in with [facility] today and stated it has helped. He does feel therapy is helping him and would like to continue, overall he feel he has made about a 20% improvement.      Pertinent History  Patient has had R TKA, and has had R/L knee pain, he also uses an inhaler to manage chroic asthma and bronchitis.     How long can you sit comfortably?  30 minutes before onset of pain    How long can you stand  comfortably?  30 mintues    How long can you walk comfortably?  10 minutes    Patient Stated Goals  Be able to move and lift things agian around the house without pain.    Currently in Pain?  Yes    Pain Score  6     Pain Location  Back    Pain Orientation  Right;Left;Lower    Pain Descriptors / Indicators  Aching    Pain Type  Chronic pain    Pain Onset  More than a month ago    Pain Frequency  Constant    Aggravating Factors   standing up transition makes it jump to a 10, stay high with, as siting he has to get bothers himif sits too long    Pain Relieving Factors  heat    Effect of Pain on Daily Activities  not driving at all, has been off because of the weather, [place]2 [place]    Multiple Pain Sites  No         [facility] PT Assessment - 03/15/17 0001      Assessment   Medical Diagnosis  Low Back Pain/Hip Pain    Referring  Provider  Jorge Mandril    Onset Date/Surgical Date  01/26/16    Next MD Visit  unsure    Prior Therapy  PT for both knees, hips abuot 6 months ago      Single Leg Stance   Comments  RLE = 30 seconds; LLE = 13 seconds; Patient with low gaurd posture to increase BOS/COM      Posture/Postural Control   Posture Comments  pateint with decreased lumbar lordosis in standing and sitting      AROM   Right Hip Extension  6    Right Hip Flexion  110    Right Hip External Rotation   44    Right Hip Internal Rotation   38    Left Hip Extension  5    Left Hip Flexion  110    Left Hip External Rotation   38    Left Hip Internal Rotation   38    Lumbar Flexion  25% limited    Lumbar Extension  50% limited    Lumbar - Right Side Bend  WFL    Lumbar - Left Side Bend  WFL    Lumbar - Right Rotation  WFL    Lumbar - Left Rotation  [facility]      Strength   Right Hip Flexion  5/5    Right Hip Extension  4/5    Right Hip External Rotation   4/5    Right Hip Internal Rotation  4/5    Right Hip ABduction  4/5    Left Hip Flexion  5/5    Left  Hip Extension  4/5    Left Hip External Rotation  4/5    Left Hip Internal Rotation  4/5    Left Hip ABduction  4/5    Right Knee Flexion  5/5    Right Knee Extension  5/5    Left Knee Flexion  5/5    Left Knee Extension  5/5      Flexibility   Hamstrings  moderate limitation BLE      FABER test   findings  Negative      Slump test   Findings  Positive      other   Findings  Negative    Comments  FADDIR: BLE       PT Education - 03/15/17 Winn    Education provided  Yes    Education Details  Educated on progression with ROM and objective measures. Educated on continuation of POC and improtance of participation and arrivign on time to therapy appointment. Educated ot continue with nerve flossing at home for HEP.    Person(s) Educated  Patient    Methods  Explanation    Comprehension  Verbalized understanding       PT Short Term Goals - 03/15/17 1327      PT SHORT TERM GOAL #1   Title  Patient will be compliant with HEP (participating daily) and be able to demonstrate proper form for exercises    Time  2    Period  Weeks    Status  On-going      PT SHORT TERM GOAL #2   Title  Patient will have increased hip ROM in BLE for flexion to 105*, extension to 8*, IR to 40* , and ER to 45* for improved functional movement    Baseline  12/20 - hip flexion/extension improved and IR/ER within normal limits    Time  3    Period  Weeks    Status  Achieved      PT SHORT TERM GOAL #3   Title  Patient will be able to perform 3MWT without onset of pain and increase his distance by 100' to demonstrate improved activity tolerance    Baseline  12/20 - continues to have pain exacerbation    Time  3    Period  Weeks    Status  On-going      PT SHORT TERM GOAL #4   Title  Patient will improve SLS on BLE to 30 seconds to demonstrate improved balance for functional mobility with ascending/descending stairs    Baseline  12/20 - 30 seconds on RLE, 12 seconds on LLE    Time  3    Period   Weeks    Status  Partially Met      PT SHORT TERM GOAL #5   Title  Patient will improve MMT byt at least 1/2 grade with all muscle groups tested to demonstrate improved functional strength    Baseline  12/20- 1/2 grade improved in all limited muscles tested    Time  3    Period  Weeks    Status  Achieved        PT Long Term Goals - 03/15/17 1328      PT LONG TERM GOAL #1   Title  Patient will have increased hip ROM in BLE for flexion to 115*, extension to 10*, IR to 40* , and ER to 45* for improved functional movement    Baseline  12/20 -hip extension/flexion remain limited    Time  6    Period  Weeks    Status  Partially Met      PT LONG TERM GOAL #2   Title  Patient will have demosntrate normal posture during functional squat with increased depth, no early heel rise, incresaed hip flexion to improve functional llifting mechanics    Baseline  12/20 - no met    Time  6    Period  Weeks    Status  On-going      PT LONG TERM GOAL #3   Title  Patient will be able to perform 6MWT without onset of pain with gait velocity of  >/= 1.80ms to demonstrate improved activity tolerance and safe community ambulation    Baseline  12/20 - slow gait wtih ongoing pain    Time  6    Period  Weeks    Status  On-going      PT LONG TERM GOAL #4   Title  Patient will improve MMT byt at least 1 grade with all muscle groups tested to demonstrate improved functional strength    Baseline  12/20 - MMT improved by 1/2 grade for muscle groups tested    Time  6    Period  Weeks    Status  On-going      PT LONG TERM GOAL #5   Title  Patient will be participating in regular (3-4/week) walking program independently or at gym to be able to return to leisure activities such as walking his dog    Baseline  12/20 - patient has not initiated    Time  6    Period  Weeks    Status  On-going        Plan - 03/15/17 1834    Clinical Impression Statement  Performed a reassessment today and patient has met  /Martin Majesticmet 3/5 short term goals and 1/5 long term goals.  Patient has had positive response to sciatic nerve glides and has found relief with use of SPC for ambulation. He remains limited by reduced participation in HEP and low attendance rate to therapy session. Patient was educated on importance of participation to see further improvements in functional mobility.  He continues to be limited by bilateral hip pain left greater than right and neural tension in left LE. He will benefit from continued skilled PT to address current impairments and progress towards remaining goals.    Rehab Potential  Good    Clinical Impairments Affecting Rehab Potential  patient is highly motivated, has good suppoprt from family, has had positive experience with PT prior to this episode    PT Frequency  2x / week    PT Duration  4 weeks    PT Treatment/Interventions  Balance training;ADLs/Self Care Home Management;Cryotherapy;Ultrasound;DME Instruction;Gait training;Electrical Stimulation;Moist Heat;Stair training;Functional mobility training;Therapeutic activities;Therapeutic exercise;Manual techniques;Patient/family education;Neuromuscular re-education;Passive range of motion;Taping    PT Next Visit Plan  Continue with POC: perform manual therapy for BLE hip joint if no increase in pain, continue hamstring stretch, functional strengthening. Perform sciatic nerve glides. Initiate hamstring isometric for pain relief trial.    PT Home Exercise Plan  Eval: repeated lumbar extension, 11/28 - hamstring stretch, clamshell, reverse clamshell;     Consulted and Agree with Plan of Care  Patient       Patient will benefit from skilled therapeutic intervention in order to improve the following deficits and impairments:  Abnormal gait, Decreased endurance, Hypomobility, Increased fascial restricitons, Decreased strength, Decreased activity tolerance, Decreased mobility, Difficulty walking, Improper body mechanics, Decreased range  of motion, Impaired flexibility, Postural dysfunction  Visit Diagnosis: Chronic bilateral low back pain with bilateral sciatica  Pain in left hip  Pain in right hip  Difficulty in walking, not elsewhere classified  Other abnormalities of gait and mobility   G-Codes - 13-Apr-2017 1915    Functional Assessment Tool Used (Outpatient Only)  objective testing, clinical judgement    Functional Limitation  Mobility: Walking and moving around    Mobility: Walking and Moving Around Current Status [phone])  At least 20 percent but less than 40 percent impaired, limited or restricted    Mobility: Walking and Moving Around Goal Status [phone])  At least 20 percent but less than 40 percent impaired, limited or restricted       Problem List Patient Active Problem List   Diagnosis Date Noted  . Chest pain 09/10/2012  . HYPERLIPIDEMIA 06/14/2007  . MALAISE AND FATIGUE 06/14/2007  . KNEE SPRAIN, RIGHT 06/14/2007    Kipp Brood, PT, DPT Physical Therapist with [facility]  April 13, 2017 7:17 PM    [place] [address] [place], [place], [address] Phone: [phone]   Fax:  [phone]  Name: KOTARO BUER MRN: [id number] Date of Birth: 11-28-1955

## 2017-03-15 NOTE — Telephone Encounter (Signed)
I called the patient's home number on file to inquire if everything is alright as he missed his 11:15 AM appointment today. He answered and informed me he thought his appointment time was later in the day and that he is currently on his way back from [place] where he had an appointment for his back. I informed him there is an opening at 1:00 PM if he would like to come in today at that time. He agreed. I also informed him he well need to schedule his future appointments after 03/19/17 one at a time as he has had too many no-shows. Patient agreed and verbalized understanding.  Valentino Saxonachel Quinn-Brown, PT, DPT Physical Therapist with [facility]  03/15/2017 11:42 AM

## 2017-03-19 ENCOUNTER — Encounter (HOSPITAL_COMMUNITY): Payer: Self-pay

## 2017-03-19 ENCOUNTER — Ambulatory Visit (HOSPITAL_COMMUNITY): Payer: Non-veteran care

## 2017-03-19 ENCOUNTER — Other Ambulatory Visit: Payer: Self-pay

## 2017-03-19 DIAGNOSIS — G8929 Other chronic pain: Secondary | ICD-10-CM

## 2017-03-19 DIAGNOSIS — R262 Difficulty in walking, not elsewhere classified: Secondary | ICD-10-CM

## 2017-03-19 DIAGNOSIS — M25552 Pain in left hip: Secondary | ICD-10-CM

## 2017-03-19 DIAGNOSIS — M5442 Lumbago with sciatica, left side: Principal | ICD-10-CM

## 2017-03-19 DIAGNOSIS — M25551 Pain in right hip: Secondary | ICD-10-CM

## 2017-03-19 DIAGNOSIS — R2689 Other abnormalities of gait and mobility: Secondary | ICD-10-CM

## 2017-03-19 DIAGNOSIS — M5441 Lumbago with sciatica, right side: Principal | ICD-10-CM

## 2017-03-19 NOTE — Therapy (Signed)
[place], [place], [address] Phone: [phone]   Fax:  [phone]  Physical Therapy Treatment  Patient Details  Name: [name] MRN: [id number] Date of Birth: 03-21-1956 Referring Provider: Jorge Mandril   Encounter Date: 03/19/2017  PT End of Session - 03/19/17 0947    Visit Number  5    Number of Visits  14    Date for PT Re-Evaluation  03/14/17    Authorization Type  [employer]    Authorization Time Period  02/14/17-03/28/16    Authorization - Visit Number  5    Authorization - Number of Visits  14    PT Start Time  0768    PT Stop Time  0881    PT Time Calculation (min)  42 min    Activity Tolerance  Patient tolerated treatment well;No increased pain    Behavior During Therapy  WFL for tasks assessed/performed       Past Medical History:  Diagnosis Date  . Asthma   . Iron deficiency anemia, unspecified   . Other and unspecified hyperlipidemia     Past Surgical History:  Procedure Laterality Date  . HERNIA REPAIR    . KNEE SURGERY     right knee    There were no vitals filed for this visit.  Subjective Assessment - 03/19/17 0946    Subjective  Patient reports he is feeling much better today. He states he is only around a 3/10 and has been able to begin his HEP again. He would like a refresher as he is not sure he is doing them correctly due to lack of consistantly.    Pertinent History  Patient has had R TKA, and has had R/L knee pain, he also uses an inhaler to manage chroic asthma and bronchitis.     How long can you sit comfortably?  --    How long can you stand comfortably?  --    How long can you walk comfortably?  --    Patient Stated Goals  Be able to move and lift things agian around the house without pain.    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    Multiple Pain Sites  No      [facility] PT Assessment - 03/19/17 0001      Sensation   Light Touch  Appears Intact    Additional Comments  intact to light touch on plantar and dorsal surface of foot, will continue to monitor due to reported tingling/burning       [facility] Adult PT Treatment/Exercise - 03/19/17 0001      Ambulation/Gait   Ambulation Distance (Feet)  450 Feet    Gait Pattern  Decreased stride length;Step-through pattern    Gait Comments  gait  with SPC for warm up, patient does not require cues for safe use of SPC and gait quality is improved with use      Posture/Postural Control   Posture Comments  --      Lumbar Exercises: Stretches   Active Hamstring Stretch  2 reps;30 seconds with rope      Lumbar Exercises: Standing   Functional Squats  10 reps;Limitations    Functional Squats Limitations  wall squat with mirror for visual feedback to facilitate equal weight shift      Lumbar Exercises: Seated   Other Seated Lumbar Exercises  Sciatic nerve flossing, LLE, 10 repetitions with 3 second holds    Other Seated Lumbar Exercises  hamstring isometric seated with LLE extended, pushing into ground      Lumbar Exercises: Supine   Bridge  15 reps;Limitations;3 seconds    Bridge Limitations  red theraband for abduction    Other Supine Lumbar Exercises  hamstring isometric with swiss ball, 5-10 second holds, 20 reps, LLE      Lumbar Exercises: Sidelying   Clam  15 reps;3 seconds;Limitations    Clam Limitations  red theraband, BLE     Other Sidelying Lumbar Exercises  reverse clamshell for hip IR, 1x 15 reps       PT Education - 03/19/17 1039    Education provided  Yes    Education Details  Reveiwed and updated HEP and educated on proper form/technique as well as progression options. Educated to perform 3-4 exercises each day at least 1 x/day.     Person(s) Educated  Patient    Methods  Explanation;Handout    Comprehension  Verbalized understanding;Returned demonstration       PT Short Term Goals - 03/15/17 1327      PT SHORT TERM GOAL #1   Title  Patient will be compliant with HEP (participating daily) and be able to demonstrate proper form for exercises    Time  2    Period  Weeks    Status  On-going      PT SHORT TERM GOAL #2   Title  Patient will have increased hip ROM in BLE for flexion to 105*, extension to 8*, IR to 40* , and ER to 45* for improved functional movement    Baseline  12/20 - hip flexion/extension improved and IR/ER within normal limits    Time  3    Period  Weeks    Status  Achieved      PT SHORT TERM GOAL #3   Title  Patient will be able to perform 3MWT without onset of pain and increase his distance by 100' to demonstrate improved activity tolerance    Baseline  12/20 - continues to have pain exacerbation    Time  3    Period  Weeks    Status  On-going      PT SHORT TERM GOAL #4   Title  Patient will improve SLS on BLE to 30 seconds to demonstrate improved balance for functional mobility with ascending/descending stairs    Baseline  12/20 - 30 seconds on RLE, 12 seconds on LLE    Time  3    Period  Weeks    Status  Partially Met      PT SHORT TERM GOAL #5   Title  Patient will improve MMT byt at least 1/2 grade with all muscle groups tested to demonstrate improved functional strength    Baseline  12/20- 1/2 grade improved in all limited  muscles tested    Time  3    Period  Weeks    Status  Achieved        PT Long Term Goals - 03/15/17 1328      PT LONG TERM GOAL #1    Title  Patient will have increased hip ROM in BLE for flexion to 115*, extension to 10*, IR to 40* , and ER to 45* for improved functional movement    Baseline  12/20 -hip extension/flexion remain limited    Time  6    Period  Weeks    Status  Partially Met      PT LONG TERM GOAL #2   Title  Patient will have demosntrate normal posture during functional squat with increased depth, no early heel rise, incresaed hip flexion to improve functional llifting mechanics    Baseline  12/20 - no met    Time  6    Period  Weeks    Status  On-going      PT LONG TERM GOAL #3   Title  Patient will be able to perform 6MWT without onset of pain with gait velocity of  >/= 1.84ms to demonstrate improved activity tolerance and safe community ambulation    Baseline  12/20 - slow gait wtih ongoing pain    Time  6    Period  Weeks    Status  On-going      PT LONG TERM GOAL #4   Title  Patient will improve MMT byt at least 1 grade with all muscle groups tested to demonstrate improved functional strength    Baseline  12/20 - MMT improved by 1/2 grade for muscle groups tested    Time  6    Period  Weeks    Status  On-going      PT LONG TERM GOAL #5   Title  Patient will be participating in regular (3-4/week) walking program independently or at gym to be able to return to leisure activities such as walking his dog    Baseline  12/20 - patient has not initiated    Time  6    Period  Weeks    Status  On-going        Plan - 03/19/17 0954    Clinical Impression Statement  Patient is progressing in therapy and had decreased pain today compared to last session. Todays session focused on a review of his HEP to increase compliance with exercises. 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 Patient continues to have a positive response to sciatic nerve glides and had a positive reaction to seated hamstring isometrics  today. He continues to find relief with use of SPC for ambulation. He remains limited by low back pain with radiating pain into bilateral hips/LE. He will benefit from continued skilled PT to address current impairments and progress towards remaining goals.    Rehab Potential  Good    Clinical Impairments Affecting Rehab Potential  patient is highly motivated, has good suppoprt from family, has had positive experience with PT prior to this episode    PT Frequency  2x / week    PT Duration  4 weeks    PT Treatment/Interventions  Balance training;ADLs/Self Care Home Management;Cryotherapy;Ultrasound;DME Instruction;Gait training;Electrical Stimulation;Moist Heat;Stair training;Functional mobility training;Therapeutic activities;Therapeutic exercise;Manual techniques;Patient/family education;Neuromuscular re-education;Passive range of motion;Taping    PT Next Visit Plan  Continue with POC: continue hamstring stretch, functional strengthening.  Perform sciatic nerve glides. 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 Followup on diabetic glucose monitor as patient has expressed concern about beign prediabetic and having tingling in his feet.    PT Home Exercise Plan  Eval: repeated lumbar extension, 11/28 - hamstring stretch, clamshell, reverse clamshell; 12/24 - updated/ reveiwed: clamshell, reverse clamshell, bridge, hamstring stretch, hamstring isometric, sciatic nerve glide    Consulted and Agree with Plan of Care  Patient       Patient will benefit from skilled therapeutic intervention in order to improve the following deficits and impairments:  Abnormal gait, Decreased endurance, Hypomobility, Increased fascial restricitons, Decreased strength, Decreased activity tolerance, Decreased mobility, Difficulty walking, Improper body mechanics, Decreased range of motion, Impaired flexibility, Postural dysfunction  Visit Diagnosis: Chronic bilateral low back pain with bilateral  sciatica  Pain in left hip  Pain in right hip  Difficulty in walking, not elsewhere classified  Other abnormalities of gait and mobility     Problem List Patient Active Problem List   Diagnosis Date Noted  . Chest pain 09/10/2012  . HYPERLIPIDEMIA 06/14/2007  . MALAISE AND FATIGUE 06/14/2007  . KNEE SPRAIN, RIGHT 06/14/2007    Kipp Brood, PT, DPT Physical Therapist with [facility]  03/19/2017 10:46 AM    [place] [address] [place], [place], [address] Phone: [phone]   Fax:  [phone]  Name: DARRIUS MONTANO MRN: [id number] Date of Birth: Apr 09, 1955

## 2017-03-19 NOTE — Patient Instructions (Signed)
   HAMSTRING STRETCH WITH TOWEL: 2-3 repetitions hold for 30-60 seconds  While lying down on your back, hook a towel or strap under  your foot and draw up your leg until a stretch is felt under your leg. calf area.  Keep your knee in a straightened position during the stretch.     ELASTIC BAND - SIDELYING CLAM - CLAMSHELL: 1-2 sets, 15 repetitions  While lying on your side with your knees bent and an elastic band wrapped around your knees, draw up the top knee while keeping contact of your feet together as shown.   Do not let your pelvis roll back during the lifting movement.        SIDELYING REVERSE CLAMS - REVERSE CLAMSHELL: 1-2 sets, 15 repetitions  While lying on your side with your knees bent, raise your top foot towards the ceiling while keeping contact of your knees together. Then, lower back down to original position. :  Do not let your pelvis roll forward during the lifting movement.        BRIDGING ELASTIC BAND ABDUCTION: 1-2 sets, 15 repetitions  While lying on your back, place an elastic band around your knees and pull your knees apart. Hold this and then tighten your lower abdominals, squeeze your buttocks and raise your buttocks off the floor/bed as creating a "Bridge" with your body.    Seated hamstring isometric: 1-2 sets, 10 repetitions with 10 second hold  Seated in chair with affected leg extended with a slight bend in your knee maximally contract the hamstring pulling the heel down trying to push it through the floor. Hold for time and then relax     SCIATIC NERVE GLIDE - SEATED: 1-2 sets, 10 repetitions  Start by sitting up straight in a chair or on the edge of a bed. Then, extend your knee and bring your toes towards you nose and hold this position while looking up. Next, bend your ankle pointing your toes down and bend you head looking down (different than picture).

## 2017-03-22 ENCOUNTER — Encounter (HOSPITAL_COMMUNITY): Payer: Self-pay

## 2017-03-22 ENCOUNTER — Encounter (HOSPITAL_COMMUNITY): Payer: Non-veteran care | Admitting: Physical Therapy

## 2017-03-22 ENCOUNTER — Ambulatory Visit (HOSPITAL_COMMUNITY): Payer: Non-veteran care

## 2017-03-22 DIAGNOSIS — R262 Difficulty in walking, not elsewhere classified: Secondary | ICD-10-CM

## 2017-03-22 DIAGNOSIS — R2689 Other abnormalities of gait and mobility: Secondary | ICD-10-CM

## 2017-03-22 DIAGNOSIS — M25552 Pain in left hip: Secondary | ICD-10-CM | POA: Diagnosis not present

## 2017-03-22 DIAGNOSIS — M25551 Pain in right hip: Secondary | ICD-10-CM

## 2017-03-22 NOTE — Patient Instructions (Signed)
Extensors / Rotators, Supine    Lie supine, one leg straight, other leg bent, knee held by opposite hand. Gently pull knee toward opposite shoulder. Feel stretch in buttocks and outside of hip. Hold 30 seconds. Repeat 3 times per session. Do 2 sessions per day.  Copyright  VHI. All rights reserved.   Hip Extension    Lie on back, legs in air, knees bent. Grasp hands behind one thigh and cross other leg over same thigh. Hold 30 seconds. Repeat with other leg held. Repeat 3 times. Do 2 sessions per day.  Copyright  VHI. All rights reserved.   Hip Stretch    Put right ankle over left knee. Let right knee fall downward, but keep ankle in place. Feel the stretch in hip. May push down gently with hand to feel stretch. Hold 30 seconds while counting out loud. Repeat with other leg. Repeat 3  times. Do 1-2 sessions per day.  http://gt2.exer.us/497   Copyright  VHI. All rights reserved.

## 2017-03-22 NOTE — Therapy (Signed)
[place], [place], [address] Phone: [phone]   Fax:  [phone]  Physical Therapy Treatment  Patient Details  Name: [name] MRN: [id number] Date of Birth: 08-16-1955 Referring Provider: Jorge Mandril   Encounter Date: 03/22/2017  PT End of Session - 03/22/17 0920    Visit Number  6    Number of Visits  14    Date for PT Re-Evaluation  03/14/17    Authorization Type  [employer]    Authorization Time Period  02/14/17-03/28/16    Authorization - Visit Number  6    Authorization - Number of Visits  14    PT Start Time  0904    PT Stop Time  0950    PT Time Calculation (min)  46 min    Activity Tolerance  Patient tolerated treatment well;No increased pain    Behavior During Therapy  WFL for tasks assessed/performed       Past Medical History:  Diagnosis Date  . Asthma   . Iron deficiency anemia, unspecified   . Other and unspecified hyperlipidemia     Past Surgical History:  Procedure Laterality Date  . HERNIA REPAIR    . KNEE SURGERY     right knee    There were no vitals filed for this visit.  Subjective Assessment - 03/22/17 0904    Subjective  Pt stated he is doing okay today, continues to c/o main difficulty with pain in Lt shin and burning in feet.  LBP pain scale 2/10 soreness, Lt LE 3/10.    Pertinent History  Patient has had R TKA, and has had R/L knee pain, he also uses an inhaler to manage chroic asthma and bronchitis.     Patient Stated Goals  Be able to move and lift things agian around the house without pain.    Currently in Pain?  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         [facility] PT Assessment - 03/22/17 0001      Assessment   Medical Diagnosis  Low Back Pain/Hip Pain    Referring Provider  Jorge Mandril    Onset Date/Surgical Date  01/26/16    Next MD Visit  unsure    Prior Therapy  PT for both knees, hips abuot 6 months ago                  [facility] Adult PT Treatment/Exercise - 03/22/17 0001      Lumbar Exercises: Stretches   Active Hamstring Stretch  3 reps;30 seconds    Single Knee to Chest Stretch  2 reps;30 seconds    Piriformis Stretch  2 reps;30 seconds supine with towel      Lumbar Exercises: Standing   Functional Squats  10 reps    Functional  Squats Limitations  cueing for form      Lumbar Exercises: Supine   Bridge  15 reps;Limitations;3 seconds    Bridge Limitations  red theraband for abduction    Other Supine Lumbar Exercises  hamstring isometric with swiss ball, 5-10 second holds, 20 reps, LLE      Lumbar Exercises: Sidelying   Clam  15 reps;3 seconds;Limitations    Clam Limitations  red theraband, BLE    Other Sidelying Lumbar Exercises  reverse clamshell for hip IR, 1x 15 reps      Manual Therapy   Manual Therapy  Neural Stretch    Manual therapy comments  performed seperate from other services    Neural Stretch  sciatic nerve glides for Lt LE in supine               PT Short Term Goals - 03/15/17 1327      PT SHORT TERM GOAL #1   Title  Patient will be compliant with HEP (participating daily) and be able to demonstrate proper form for exercises    Time  2    Period  Weeks    Status  On-going      PT SHORT TERM GOAL #2   Title  Patient will have increased hip ROM in BLE for flexion to 105*, extension to 8*, IR to 40* , and ER to 45* for improved functional movement    Baseline  12/20 - hip  flexion/extension improved and IR/ER within normal limits    Time  3    Period  Weeks    Status  Achieved      PT SHORT TERM GOAL #3   Title  Patient will be able to perform 3MWT without onset of pain and increase his distance by 100' to demonstrate improved activity tolerance    Baseline  12/20 - continues to have pain exacerbation    Time  3    Period  Weeks    Status  On-going      PT SHORT TERM GOAL #4   Title  Patient will improve SLS on BLE to 30 seconds to demonstrate improved balance for functional mobility with ascending/descending stairs    Baseline  12/20 - 30 seconds on RLE, 12 seconds on LLE    Time  3    Period  Weeks    Status  Partially Met      PT SHORT TERM GOAL #5   Title  Patient will improve MMT byt at least 1/2 grade with all muscle groups tested to demonstrate improved functional strength    Baseline  12/20- 1/2 grade improved in all limited muscles tested    Time  3    Period  Weeks    Status  Achieved        PT Long Term Goals - 03/15/17 1328      PT LONG TERM GOAL #1   Title  Patient will have increased hip ROM in BLE for flexion to 115*, extension to 10*, IR to 40* , and ER to 45* for improved functional movement    Baseline  12/20 -hip extension/flexion remain limited    Time  6    Period  Weeks    Status  Partially Met      PT LONG TERM GOAL #2   Title  Patient will have demosntrate normal posture during functional squat with increased depth, no early heel rise, incresaed hip flexion to improve functional llifting mechanics    Baseline  12/20 - no met    Time  6    Period  Weeks    Status  On-going      PT LONG TERM GOAL #3   Title  Patient will be able to perform 6MWT without onset of pain with gait velocity of  >/= 1.26ms to demonstrate improved activity tolerance and safe community ambulation    Baseline  12/20 - slow gait wtih ongoing pain    Time  6    Period  Weeks    Status  On-going      PT LONG TERM GOAL #4   Title  Patient  will improve MMT byt at least 1 grade with all muscle groups tested to demonstrate improved functional strength    Baseline  12/20 - MMT improved by 1/2 grade for muscle groups tested    Time  6    Period  Weeks    Status  On-going      PT LONG TERM GOAL #5   Title  Patient will be participating in regular (3-4/week) walking program independently or at gym to be able to return to leisure activities such as walking his dog    Baseline  12/20 - patient has not initiated    Time  6    Period  Weeks    Status  On-going            Plan - 03/22/17 0940    Clinical Impression Statement  Pt is progressing well towards goals with reports of decrease in pain and reports of intermittent radicular symptoms.  Session focus on proximal strengthening for lumbar support and hip stability as well as LE stretches.  Pt presents with tight hip ERs, additional piriformis stretches complete this session to improve hip mobility with gait.  Pt given piriformis stretch printout to add to HEP.  Pt educated on importance of muscle lengthening to improve seated posture and reduce pain as well as lumbar support for seating.  EOS reports of no back pain and reduced radicular pain to 2/10.      Rehab Potential  Good    Clinical Impairments Affecting Rehab Potential  patient is highly motivated, has good suppoprt from family, has had positive experience with PT prior to this episode    PT Frequency  2x / week    PT Duration  4 weeks    PT Treatment/Interventions  Balance training;ADLs/Self Care Home Management;Cryotherapy;Ultrasound;DME Instruction;Gait training;Electrical Stimulation;Moist Heat;Stair training;Functional mobility training;Therapeutic activities;Therapeutic exercise;Manual techniques;Patient/family education;Neuromuscular re-education;Passive range of motion;Taping    PT Next Visit Plan  Added 3D hip excursion next session.  Continue with POC: continue hamstring stretch, functional strengthening. Perform  sciatic nerve glides. 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 Followup on diabetic glucose monitor as patient has expressed concern about beign prediabetic and having tingling in his feet.    PT Home Exercise Plan  Eval: repeated lumbar extension, 11/28 - hamstring stretch, clamshell, reverse clamshell; 12/24 - updated/ reveiwed: clamshell, reverse clamshell, bridge, hamstring stretch, hamstring isometric, sciatic nerve glide; 03/22/17: Piriformis stretches       Patient will benefit from skilled therapeutic intervention in order to improve the following deficits and impairments:  Abnormal gait, Decreased endurance, Hypomobility, Increased fascial restricitons, Decreased strength, Decreased activity tolerance, Decreased mobility, Difficulty walking, Improper body mechanics, Decreased range of motion, Impaired flexibility, Postural dysfunction  Visit Diagnosis: Pain in left hip  Pain in right hip  Difficulty in walking, not elsewhere classified  Other abnormalities of gait and  mobility     Problem List Patient Active Problem List   Diagnosis Date Noted  . Chest pain 09/10/2012  . HYPERLIPIDEMIA 06/14/2007  . MALAISE AND FATIGUE 06/14/2007  . KNEE SPRAIN, RIGHT 06/14/2007   Ihor Austin, LPTA; CBIS [phone]  Aldona Lento 03/22/2017, 10:03 AM  [place], [place], [address] Phone: [phone]   Fax:  [phone]  Name: [name] MRN: [id number] Date of Birth: [birth year]

## 2017-03-26 ENCOUNTER — Encounter (HOSPITAL_COMMUNITY): Payer: Non-veteran care

## 2017-03-28 ENCOUNTER — Ambulatory Visit (HOSPITAL_COMMUNITY): Payer: Non-veteran care

## 2017-03-29 ENCOUNTER — Encounter (HOSPITAL_COMMUNITY): Payer: Non-veteran care

## 2017-04-03 ENCOUNTER — Ambulatory Visit (HOSPITAL_COMMUNITY): Payer: Non-veteran care

## 2017-04-03 ENCOUNTER — Telehealth (HOSPITAL_COMMUNITY): Payer: Self-pay

## 2017-04-03 ENCOUNTER — Telehealth (HOSPITAL_COMMUNITY): Payer: Self-pay | Admitting: Internal Medicine

## 2017-04-03 NOTE — Telephone Encounter (Signed)
04/03/17  pt called at 2:53 and asked if he could reschedule his appt

## 2017-04-03 NOTE — Telephone Encounter (Signed)
No Show #1: I called Mr. [name] personally and spoke with him directly. I informed him we had an appointment scheduled for today at 2:30 PM. He stated he was having too much back pain and would not be in today. He asked to re-schedule the appointment and I transferred his call to the front desk.  Valentino Saxonachel Quinn-Brown, PT, DPT Physical Therapist with [place]  04/03/2017 2:54 PM

## 2017-04-05 ENCOUNTER — Ambulatory Visit (HOSPITAL_COMMUNITY): Payer: Non-veteran care

## 2017-04-05 ENCOUNTER — Telehealth (HOSPITAL_COMMUNITY): Payer: Self-pay | Admitting: Internal Medicine

## 2017-04-05 NOTE — Telephone Encounter (Signed)
04/05/17  pt cx said he has the flu - I told him since it was his last appt on the schedule we would call him back in about a week to reschedule

## 2017-07-16 ENCOUNTER — Encounter (HOSPITAL_COMMUNITY): Payer: Self-pay

## 2017-07-16 NOTE — Therapy (Signed)
[place], [place], [address] Phone: [phone]   Fax:  [phone]  Patient Details  Name: [name] MRN: [id number] Date of Birth: [birth year] Referring Provider:  No ref. provider found  Encounter Date: 07/16/2017   PHYSICAL THERAPY DISCHARGE SUMMARY  Visits from Start of Care: 6  Current functional level related to goals / functional outcomes: Last visit on 03/22/2017. Patient is being discharged as he has not returned since last visit. He cancelled his appointments on 04/05/2017 due to feeling unwell and did not call back to reschedule. Below if is functional status from last visit and his goal progress.  Clinical Impression from 03/22/2017 "Pt is progressing well towards goals with reports of decrease in pain and reports of intermittent radicular symptoms.  Session focus on proximal strengthening for lumbar support and hip stability as well as LE stretches.  Pt presents with tight hip ERs, additional piriformis stretches complete this session to improve hip mobility with gait.  Pt given piriformis stretch printout to add to HEP.  Pt educated on importance of muscle lengthening to improve seated posture and reduce pain as well as lumbar support for seating.  EOS reports of no back pain and reduced radicular pain to 2/10."    -Ihor Austin, LPTA; CBIS    Remaining deficits: PT Short Term Goals - 03/15/17 1327            PT SHORT TERM GOAL #1   Title  Patient will be compliant with HEP (participating daily) and be able to demonstrate proper form for exercises    Time  2    Period  Weeks    Status  On-going        PT SHORT TERM GOAL #2   Title  Patient will have increased hip ROM in BLE for flexion to 105*, extension to 8*, IR to 40* , and ER to 45* for improved functional movement    Baseline  12/20 - hip flexion/extension improved and IR/ER within normal limits    Time  3    Period   Weeks    Status  Achieved        PT SHORT TERM GOAL #3   Title  Patient will be able to perform 3MWT without onset of pain and increase his distance by 100' to demonstrate improved activity tolerance    Baseline  12/20 - continues to have pain exacerbation    Time  3    Period  Weeks    Status  On-going        PT SHORT TERM GOAL #4   Title  Patient will improve SLS on BLE to 30 seconds to demonstrate improved balance for functional mobility with ascending/descending stairs    Baseline  12/20 - 30 seconds on RLE, 12 seconds on LLE    Time  3    Period  Weeks    Status  Partially Met        PT SHORT TERM GOAL #5   Title  Patient will improve MMT byt at least 1/2 grade with all muscle groups tested to demonstrate improved functional strength    Baseline  12/20- 1/2 grade improved in all limited muscles tested    Time  3    Period  Weeks    Status  Achieved           PT Long Term Goals - 03/15/17 1328  PT LONG TERM GOAL #1   Title  Patient will have increased hip ROM in BLE for flexion to 115*, extension to 10*, IR to 40* , and ER to 45* for improved functional movement    Baseline  12/20 -hip extension/flexion remain limited    Time  6    Period  Weeks    Status  Partially Met        PT LONG TERM GOAL #2   Title  Patient will have demosntrate normal posture during functional squat with increased depth, no early heel rise, incresaed hip flexion to improve functional llifting mechanics    Baseline  12/20 - no met    Time  6    Period  Weeks    Status  On-going        PT LONG TERM GOAL #3   Title  Patient will be able to perform 6MWT without onset of pain with gait velocity of  >/= 1.3ms to demonstrate improved activity tolerance and safe community ambulation    Baseline  12/20 - slow gait wtih ongoing pain    Time  6    Period  Weeks    Status  On-going        PT LONG TERM GOAL #4   Title  Patient will improve  MMT byt at least 1 grade with all muscle groups tested to demonstrate improved functional strength    Baseline  12/20 - MMT improved by 1/2 grade for muscle groups tested    Time  6    Period  Weeks    Status  On-going        PT LONG TERM GOAL #5   Title  Patient will be participating in regular (3-4/week) walking program independently or at gym to be able to return to leisure activities such as walking his dog    Baseline  12/20 - patient has not initiated    Time  6    Period  Weeks    Status  On-going      Education / Equipment: Patient had been educated on aMaterials engineerwith therapy and on HEP for independent strengthening at home.   Plan: Patient agrees to discharge.  Patient goals were not met. Patient is being discharged due to not returning since the last visit.  ?????     RKipp Brood PT, DPT Physical Therapist [facility] 07/16/2017 11:00 AM    [place] [address]Phone: [phone]  Fax:  [phone]

## 2017-12-06 ENCOUNTER — Encounter: Payer: Self-pay | Admitting: Neurology

## 2018-01-14 ENCOUNTER — Institutional Professional Consult (permissible substitution): Payer: Medicare Other | Admitting: Neurology

## 2018-02-14 ENCOUNTER — Telehealth: Payer: Self-pay

## 2018-02-14 ENCOUNTER — Institutional Professional Consult (permissible substitution): Payer: Medicare Other | Admitting: Neurology

## 2018-02-14 NOTE — Telephone Encounter (Signed)
Pt did not show for their appt with Dr. Athar today.  

## 2018-02-26 ENCOUNTER — Encounter: Payer: Self-pay | Admitting: Neurology

## 2018-03-08 IMAGING — CR DG CHEST 2V
2 series · 2 of 2 positions shown · non-contrast
Comparison: 04/15/2016, 02/13/2016 and earlier, including CT chest
03/08/2016.

CLINICAL DATA: 61-year-old with current history of asthma,
presenting with 8 month history of chronic cough and wheezing.
Nonsmoker.

EXAM:
CHEST  2 VIEW

[w chest pa]
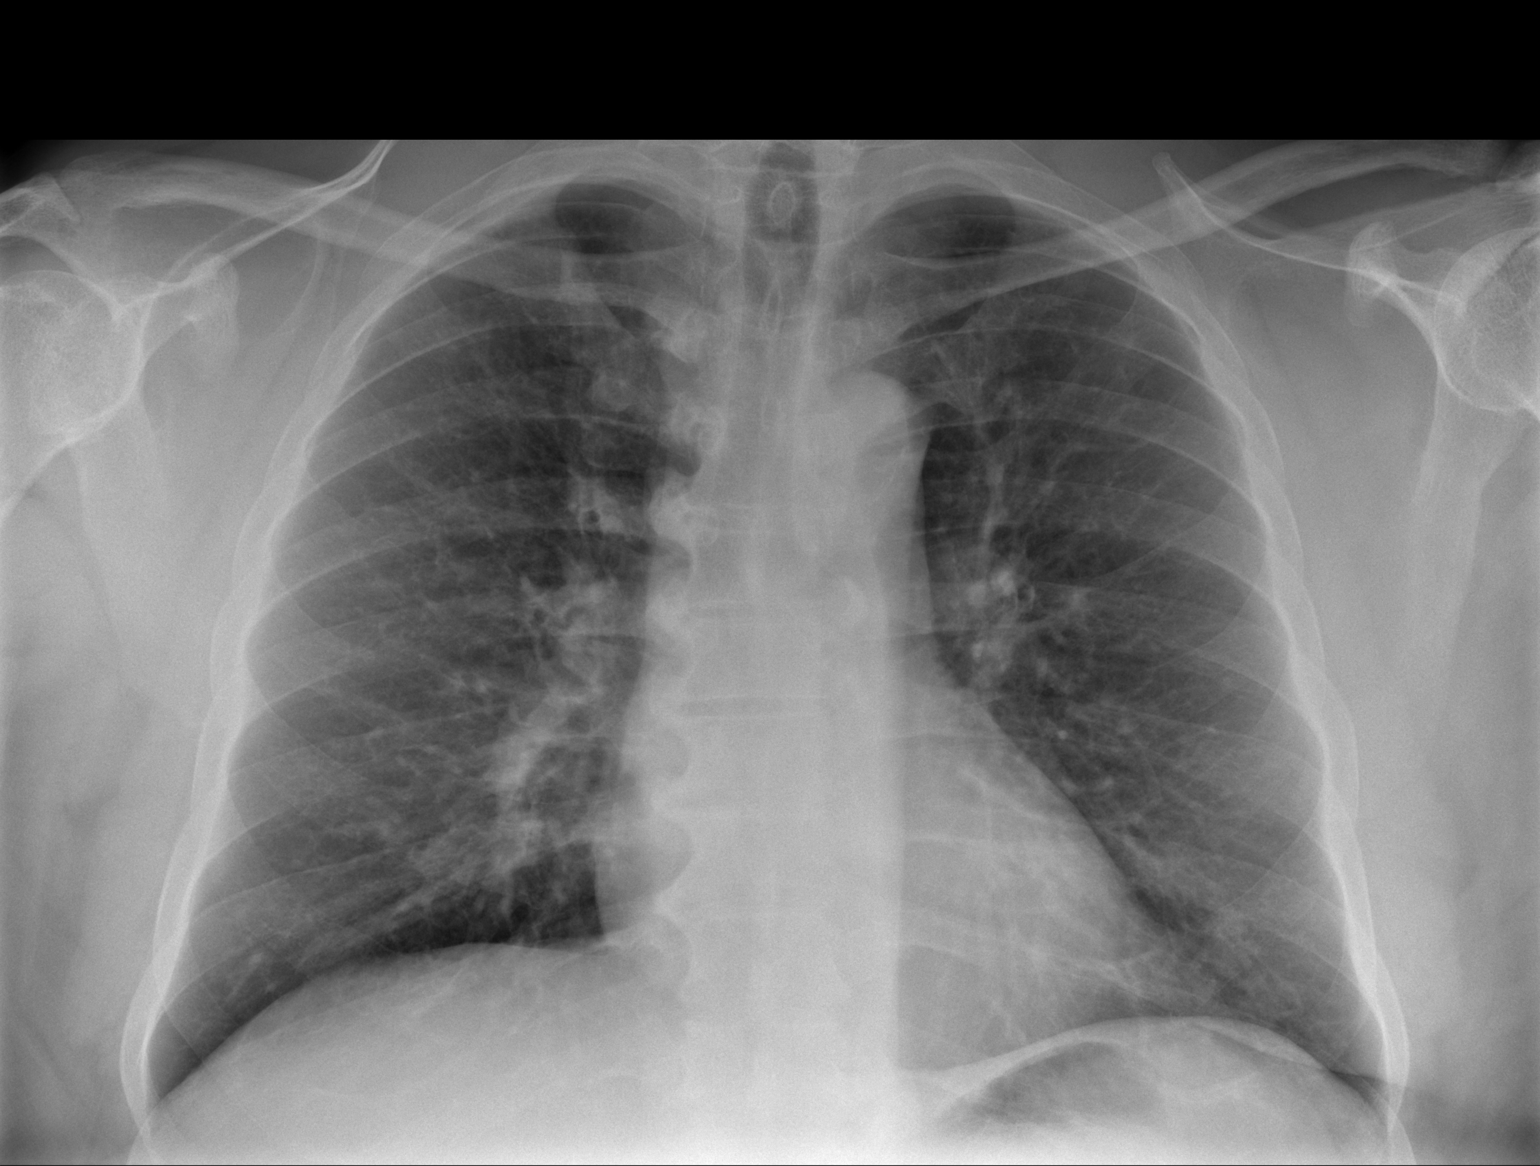

[w chest lat]
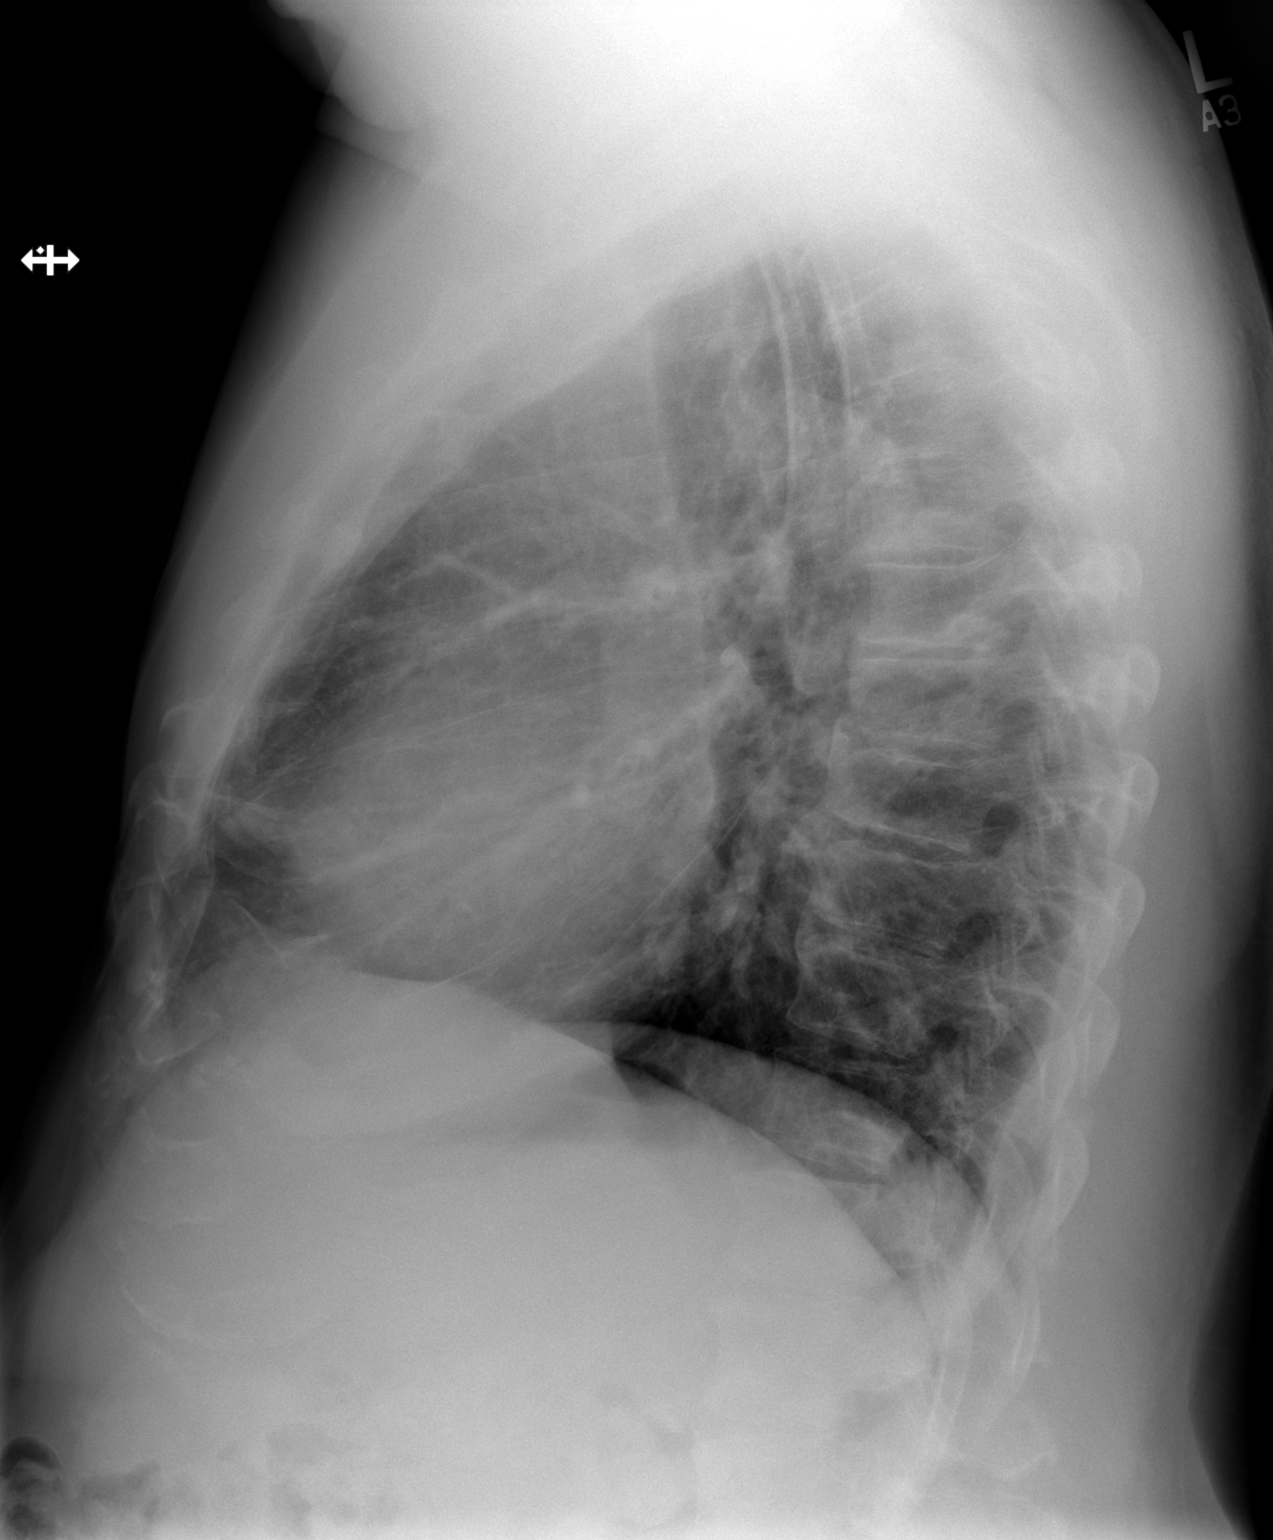

[2 of 2 positions shown; findings below may reference images not displayed]

FINDINGS: Cardiac silhouette normal in size, unchanged. Minimal
atherosclerosis involving the aortic arch, unchanged. Hilar and
mediastinal contours otherwise unremarkable. Mild central
peribronchial thickening, unchanged. No new pulmonary parenchymal
abnormalities. No pleural effusions. Degenerative changes and DISH
involving the thoracic spine.
IMPRESSION: Stable mild changes of chronic bronchitis and/or asthma. No acute
cardiopulmonary disease.

## 2018-04-24 ENCOUNTER — Telehealth: Payer: Self-pay

## 2018-04-24 ENCOUNTER — Institutional Professional Consult (permissible substitution): Payer: Medicare Other | Admitting: Neurology

## 2018-04-24 NOTE — Telephone Encounter (Signed)
Pt arrived 7 minutes to his appt and was asked by Dr. Frances Furbish to reschedule.

## 2018-04-29 ENCOUNTER — Emergency Department (HOSPITAL_COMMUNITY)
Admission: EM | Admit: 2018-04-29 | Discharge: 2018-04-29 | Discharge status: Against Medical Advice | Payer: Non-veteran care

## 2018-04-29 NOTE — ED Notes (Signed)
No answer when called x1 

## 2018-06-05 ENCOUNTER — Institutional Professional Consult (permissible substitution): Payer: Medicare Other | Admitting: Neurology

## 2018-06-05 NOTE — Telephone Encounter (Signed)
Please follow dismissal protocol as per our No Show Policy.   

## 2018-06-05 NOTE — Telephone Encounter (Signed)
Pt did not show for their appt with Dr. Frances Furbish today.  This is pt's second no show for a new patient consult this year and he arrived too late to be seen for his last appt with [place]. Pt [facility] policy, pt meets criteria for dismissal.

## 2018-06-06 ENCOUNTER — Encounter: Payer: Self-pay | Admitting: Neurology

## 2018-06-11 ENCOUNTER — Encounter: Payer: Self-pay | Admitting: Neurology

## 2018-09-20 ENCOUNTER — Encounter (HOSPITAL_COMMUNITY): Payer: Self-pay | Admitting: Emergency Medicine

## 2018-09-20 ENCOUNTER — Emergency Department (HOSPITAL_COMMUNITY)
Admission: EM | Admit: 2018-09-20 | Discharge: 2018-09-20 | Disposition: A | Discharge status: Home / Self Care | Payer: No Typology Code available for payment source | Attending: Emergency Medicine | Admitting: Emergency Medicine

## 2018-09-20 ENCOUNTER — Other Ambulatory Visit: Payer: Self-pay

## 2018-09-20 DIAGNOSIS — K0889 Other specified disorders of teeth and supporting structures: Secondary | ICD-10-CM | POA: Insufficient documentation

## 2018-09-20 MED ORDER — IBUPROFEN 600 MG PO TABS: 600.0000 mg | ORAL_TABLET | Freq: Four times a day (QID) | ORAL | 0 refills | Status: DC | PRN

## 2018-09-20 MED ORDER — ACETAMINOPHEN 500 MG PO TABS: 1000.0000 mg | ORAL_TABLET | Freq: Three times a day (TID) | ORAL | 0 refills | Status: AC | PRN

## 2018-09-20 MED ORDER — PENICILLIN V POTASSIUM 500 MG PO TABS: 500.0000 mg | ORAL_TABLET | Freq: Four times a day (QID) | ORAL | 0 refills | Status: AC

## 2018-09-20 NOTE — ED Provider Notes (Signed)
[facility] EMERGENCY DEPARTMENT Provider Note   CSN: [id number] Arrival date & time: 09/20/18  1008     History   Chief Complaint Chief Complaint  Patient presents with  . Dental Pain    HPI [name] is a 63 y.o. male with history of asthma, IDA who presents with a 4-day history of left lower dental pain.  Patient reports he has had problems with a tooth before, as it is broken.  He has not seen a dentist for this problem and does not currently have one.  Patient has been trying Aleve and Tylenol at home without significant relief.  He denies any fevers, neck pain, lockjaw.     HPI  Past Medical History:  Diagnosis Date  . Asthma   . Iron deficiency anemia, unspecified   . Other and unspecified hyperlipidemia     Patient Active Problem List   Diagnosis Date Noted  . Chest pain 09/10/2012  . HYPERLIPIDEMIA 06/14/2007  . MALAISE AND FATIGUE 06/14/2007  . KNEE SPRAIN, RIGHT 06/14/2007    Past Surgical History:  Procedure Laterality Date  . HERNIA REPAIR    . KNEE SURGERY     right knee        Home Medications    Prior to Admission medications   Medication Sig Start Date End Date Taking? Authorizing Provider  acetaminophen (TYLENOL) 500 MG tablet Take 2 tablets (1,000 mg total) by mouth every 8 (eight) hours as needed. 09/20/18   Dejia Ebron, Bea Graff, PA-C  albuterol (PROVENTIL HFA;VENTOLIN HFA) 108 (90 Base) MCG/ACT inhaler Inhale 2 puffs into the lungs every 6 (six) hours as needed for wheezing or shortness of breath.    [provider]  atorvastatin (LIPITOR) 20 MG tablet Take 10 mg by mouth daily.    [provider]  Cholecalciferol (VITAMIN D3 PO) Take 2,000 Units by mouth daily.    [provider]  cyclobenzaprine (FLEXERIL) 10 MG tablet Take 10 mg by mouth 3 (three) times daily as needed for muscle spasms.    [provider]  famotidine (PEPCID) 20 MG tablet Take 20 mg by mouth 2 (two) times daily.     [provider]  gabapentin (NEURONTIN) 300 MG capsule Take 300 mg by mouth at bedtime.    [provider]  ibuprofen (ADVIL) 600 MG tablet Take 1 tablet (600 mg total) by mouth every 6 (six) hours as needed. 09/20/18   Azarias Chiou, Bea Graff, PA-C  MOMETASONE FURO-FORMOTEROL FUM IN Inhale into the lungs.    [provider]  penicillin v potassium (VEETID) 500 MG tablet Take 1 tablet (500 mg total) by mouth 4 (four) times daily for 7 days. 09/20/18 09/27/18  Frederica Kuster, PA-C  predniSONE (DELTASONE) 20 MG tablet 3 tabs po daily x 3 days, then 2 tabs x 3 days, then 1.5 tabs x 3 days, then 1 tab x 3 days, then 0.5 tabs x 3 days Patient not taking: Reported on 02/14/2017 09/19/16   Mesner, Corene Cornea, MD    Family History Family History  Problem Relation Age of Onset  . Rheum arthritis Mother   . Diabetes Mother   . Cancer Mother     Social History Social History   Tobacco Use  . Smoking status: Never Smoker  . Smokeless tobacco: Never Used  Substance Use Topics  . Alcohol use: No  . Drug use: No     Allergies   Hydrocodone   Review of Systems Review of Systems  Constitutional: Negative for fever.  HENT: Positive for dental problem.   Musculoskeletal: Negative for neck pain.     Physical Exam Updated Vital Signs BP 129/83 (BP Location: Right Arm)   Pulse (!) 55   Temp 98.5 F (36.9 C) (Oral)   Resp 12   Ht 5\' 9"  (1.753 m)   Wt 105.2 kg   SpO2 100%   BMI 34.26 kg/m   Physical Exam Vitals signs and nursing note reviewed.  Constitutional:      General: He is not in acute distress.    Appearance: He is well-developed. He is not diaphoretic.  HENT:     Head: Normocephalic and atraumatic.     Mouth/Throat:     Pharynx: No oropharyngeal exudate.     Tonsils: No tonsillar abscesses.      Comments: Generally poor dentition, swelling to L jaw; no trismus; no submandibular tenderness Eyes:     General: No scleral icterus.       Right eye: No  discharge.        Left eye: No discharge.     Conjunctiva/sclera: Conjunctivae normal.     Pupils: Pupils are equal, round, and reactive to light.  Neck:     Musculoskeletal: Normal range of motion and neck supple.     Thyroid: No thyromegaly.  Cardiovascular:     Rate and Rhythm: Normal rate and regular rhythm.     Heart sounds: Normal heart sounds. No murmur. No friction rub. No gallop.   Pulmonary:     Effort: Pulmonary effort is normal. No respiratory distress.     Breath sounds: Normal breath sounds. No stridor. No wheezing or rales.  Lymphadenopathy:     Cervical: No cervical adenopathy.  Skin:    General: Skin is warm and dry.     Coloration: Skin is not pale.     Findings: No rash.  Neurological:     Mental Status: He is alert.     Coordination: Coordination normal.      ED Treatments / Results  Labs (all labs ordered are listed, but only abnormal results are displayed) Labs Reviewed - No data to display  EKG    Radiology No results found.  Procedures Procedures (including critical care time)  Medications Ordered in ED Medications - No data to display   Initial Impression / Assessment and Plan / ED Course  I have reviewed the triage vital signs and the nursing notes.  Pertinent labs & imaging results that were available during my care of the patient were reviewed by me and considered in my medical decision making (see chart for details).        Patient with dentalgia.  No abscess requiring immediate incision and drainage.  Exam not concerning for Ludwig's angina or pharyngeal abscess.  Will treat with penicillin, Advil instead of Aleve and Tylenol 1000 mg every 8 hours.  Patient offered dental block in the ED, however he declined.  Pt instructed to follow-up with dentist.  Patient given several resources.  Discussed return precautions.  Patient vital stable throughout ED course and discharged in satisfactory condition.   Final Clinical Impressions(s) /  ED Diagnoses   Final diagnoses:  Pain, dental    ED Discharge Orders         Ordered    penicillin v potassium (VEETID) 500 MG tablet  4 times daily     09/20/18 1109    ibuprofen (ADVIL) 600 MG tablet  Every 6 hours PRN  09/20/18 1109    acetaminophen (TYLENOL) 500 MG tablet  Every 8 hours PRN     09/20/18 1109           Emi HolesLaw, Kearra Calkin M, PA-C 09/20/18 1116    Tilden Fossaees, Elizabeth, MD 09/20/18 1655

## 2018-09-20 NOTE — ED Triage Notes (Signed)
Pt reports he broke a tooth a month ago and now same is bothering and he has an abscess.

## 2018-09-20 NOTE — ED Notes (Signed)
Patient verbalizes understanding of discharge instructions. Opportunity for questioning and answers were provided. Armband removed by staff, pt discharged from ED home via POV.  

## 2018-09-20 NOTE — Discharge Instructions (Signed)
Make sure to take all of the penicillin.  Take ibuprofen every 6 hours as prescribed.  Alternate with Tylenol as prescribed every 8 hours.  Please follow-up with Dr. Haig Prophet by calling the office within 24 hours and bring your discharge paperwork to your appointment and [facility] may be able to help with your finances.  If this does not work out, there are other resources below.  Please return to the emergency department if you develop any new or worsening symptoms including lockjaw, persistent fever 100.4, swelling in your neck, or any other concerning symptoms.

## 2019-11-02 ENCOUNTER — Emergency Department (HOSPITAL_COMMUNITY)
Admission: EM | Admit: 2019-11-02 | Discharge: 2019-11-02 | Disposition: A | Discharge status: Home / Self Care | Payer: No Typology Code available for payment source | Attending: Emergency Medicine | Admitting: Emergency Medicine

## 2019-11-02 ENCOUNTER — Other Ambulatory Visit: Payer: Self-pay

## 2019-11-02 DIAGNOSIS — J45909 Unspecified asthma, uncomplicated: Secondary | ICD-10-CM | POA: Insufficient documentation

## 2019-11-02 DIAGNOSIS — M79605 Pain in left leg: Secondary | ICD-10-CM | POA: Insufficient documentation

## 2019-11-02 DIAGNOSIS — Z7951 Long term (current) use of inhaled steroids: Secondary | ICD-10-CM | POA: Insufficient documentation

## 2019-11-02 MED ORDER — KETOROLAC TROMETHAMINE 60 MG/2ML IM SOLN
30.0000 mg | Freq: Once | INTRAMUSCULAR | Status: AC
  Administered 2019-11-02: 30 mg via INTRAMUSCULAR
  Filled 2019-11-02: qty 2

## 2019-11-02 NOTE — ED Triage Notes (Signed)
Patient complains of left leg pain x3 months, worse tonight. Patient denies any trauma to the leg, concerned for DVT. Patient is not on blood thinners, takes med for cholesterol. Patient is ambulatory with personal walker, AxOx4.

## 2019-11-02 NOTE — ED Provider Notes (Signed)
[facility]-EMERGENCY DEPT Provider Note  CSN: [id number] Arrival date & time: 11/02/19 0035  Chief Complaint(s) Leg Pain  HPI [name] is a 64 y.o. male   CC: Left leg pain  Onset/Duration: Several months ago Timing: Initially intermittent but constant for 3 months Location: Left lower leg just above the ankle Quality: Aching Severity: Moderate Modifying Factors:  Improved by: Nothing  Worsened by: Palpation and ambulation Associated Signs/Symptoms:  Pertinent (+): None  Pertinent (-): Patient denies any trauma.  No swelling or erythema.  No wounds. Context:    HPI  Past Medical History Past Medical History:  Diagnosis Date   Asthma    Iron deficiency anemia, unspecified    Other and unspecified hyperlipidemia    Patient Active Problem List   Diagnosis Date Noted   Chest pain 09/10/2012   HYPERLIPIDEMIA 06/14/2007   MALAISE AND FATIGUE 06/14/2007   KNEE SPRAIN, RIGHT 06/14/2007   Home Medication(s) Prior to Admission medications   Medication Sig Start Date End Date Taking? Authorizing Provider  acetaminophen (TYLENOL) 500 MG tablet Take 2 tablets (1,000 mg total) by mouth every 8 (eight) hours as needed. 09/20/18  Yes Law, Waylan Boga, PA-C  albuterol (PROVENTIL HFA;VENTOLIN HFA) 108 (90 Base) MCG/ACT inhaler Inhale 2 puffs into the lungs every 6 (six) hours as needed for wheezing or shortness of breath.   Yes [provider]  atorvastatin (LIPITOR) 20 MG tablet Take 10 mg by mouth daily.   Yes [provider]  cyclobenzaprine (FLEXERIL) 10 MG tablet Take 10 mg by mouth 3 (three) times daily as needed for muscle spasms.   Yes [provider]  famotidine (PEPCID) 20 MG tablet Take 20 mg by mouth 2 (two) times daily.   Yes [provider]  gabapentin (NEURONTIN) 300 MG capsule Take 300 mg by mouth at bedtime.   Yes [provider]  Cholecalciferol (VITAMIN D3 PO) Take 2,000 Units by  mouth daily.    [provider]  MOMETASONE FURO-FORMOTEROL FUM IN Inhale into the lungs.    [provider]                                                                                                                                    Past Surgical History Past Surgical History:  Procedure Laterality Date   HERNIA REPAIR     KNEE SURGERY     right knee   Family History Family History  Problem Relation Age of Onset   Rheum arthritis Mother    Diabetes Mother    Cancer Mother     Social History Social History   Tobacco Use   Smoking status: Never Smoker   Smokeless tobacco: Never Used  Building services engineer Use: Never used  Substance Use Topics   Alcohol use: No   Drug use: No   Allergies Hydrocodone  Review of Systems Review of Systems All  other systems are reviewed and are negative for acute change except as noted in the HPI  Physical Exam Vital Signs  I have reviewed the triage vital signs BP 127/69 (BP Location: Right Arm)    Pulse 84    Temp 98.4 F (36.9 C) (Oral)    Resp 18    Ht 5\' 9"  (1.753 m)    Wt 104.3 kg    SpO2 97%    BMI 33.97 kg/m   Physical Exam Vitals reviewed.  Constitutional:      General: He is not in acute distress.    Appearance: He is well-developed. He is not diaphoretic.  HENT:     Head: Normocephalic and atraumatic.     Jaw: No trismus.     Right Ear: External ear normal.     Left Ear: External ear normal.     Nose: Nose normal.  Eyes:     General: No scleral icterus.    Conjunctiva/sclera: Conjunctivae normal.  Neck:     Trachea: Phonation normal.  Cardiovascular:     Rate and Rhythm: Normal rate and regular rhythm.     Pulses:          Dorsalis pedis pulses are 2+ on the right side and 2+ on the left side.  Pulmonary:     Effort: Pulmonary effort is normal. No respiratory distress.     Breath sounds: No stridor.  Abdominal:     General: There is no distension.  Musculoskeletal:         General: Normal range of motion.     Cervical back: Normal range of motion.     Left lower leg: Tenderness present. No swelling, deformity, lacerations or bony tenderness. No edema.     Left ankle: No swelling, deformity, ecchymosis or lacerations. No tenderness. Normal range of motion. Normal pulse.       Feet:  Neurological:     Mental Status: He is alert and oriented to person, place, and time.  Psychiatric:        Behavior: Behavior normal.     ED Results and Treatments Labs (all labs ordered are listed, but only abnormal results are displayed) Labs Reviewed - No data to display                                                                                                                       EKG  EKG Interpretation  Date/Time:    Ventricular Rate:    PR Interval:    QRS Duration:   QT Interval:    QTC Calculation:   R Axis:     Text Interpretation:        Radiology No results found.  Pertinent labs & imaging results that were available during my care of the patient were reviewed by me and considered in my medical decision making (see chart for details).  Medications Ordered in ED Medications  ketorolac (TORADOL) injection 30 mg (30 mg Intramuscular Given 11/02/19 0415)  Procedures Procedures  (including critical care time)  Medical Decision Making / ED Course I have reviewed the nursing notes for this encounter and the patient's prior records (if available in EHR or on provided paperwork).   [name] was evaluated in Emergency Department on 11/02/2019 for the symptoms described in the history of present illness. He was evaluated in the context of the global COVID-19 pandemic, which necessitated consideration that the patient might be at risk for infection with the SARS-CoV-2 virus that causes COVID-19. Institutional protocols  and algorithms that pertain to the evaluation of patients at risk for COVID-19 are in a state of rapid change based on information released by regulatory bodies including the CDC and federal and state organizations. These policies and algorithms were followed during the patient's care in the ED.  Left lower leg pain. On lower aspect of the leg just above the ankle on the anterior aspect. No evidence of swelling or edema.  No wounds.  No erythema.  No evidence of infection. Low suspicion for DVT. Pulses intact.  Doubt arterial occlusion.  Possible soft tissue process including tendinitis. Provided with IM Toradol resulting in significant relief RICE recommended.      Final Clinical Impression(s) / ED Diagnoses Final diagnoses:  Left leg pain   The patient appears reasonably screened and/or stabilized for discharge and I doubt any other medical condition or other [facility] requiring further screening, evaluation, or treatment in the ED at this time prior to discharge. Safe for discharge with strict return precautions.  Disposition: Discharge  Condition: Good  I have discussed the results, Dx and Tx plan with the patient/family who expressed understanding and agree(s) with the plan. Discharge instructions discussed at length. The patient/family was given strict return precautions who verbalized understanding of the instructions. No further questions at time of discharge.    ED Discharge Orders    None        Follow Up: Jalene Mullet, MD [address] [place] [address] [phone]  Schedule an appointment as soon as possible for a visit  As needed      This chart was dictated using voice recognition software.  Despite best efforts to proofread,  errors can occur which can change the documentation meaning.   Nira Conn, MD 11/02/19 [phone]

## 2019-12-02 ENCOUNTER — Other Ambulatory Visit: Payer: Self-pay

## 2019-12-02 ENCOUNTER — Encounter (HOSPITAL_COMMUNITY): Payer: Self-pay

## 2019-12-02 ENCOUNTER — Emergency Department (HOSPITAL_COMMUNITY)
Admission: EM | Admit: 2019-12-02 | Discharge: 2019-12-02 | Disposition: A | Discharge status: Home / Self Care | Payer: No Typology Code available for payment source | Attending: Emergency Medicine | Admitting: Emergency Medicine

## 2019-12-02 DIAGNOSIS — Z5321 Procedure and treatment not carried out due to patient leaving prior to being seen by health care provider: Secondary | ICD-10-CM | POA: Insufficient documentation

## 2019-12-02 DIAGNOSIS — R1011 Right upper quadrant pain: Secondary | ICD-10-CM | POA: Insufficient documentation

## 2019-12-02 LAB — COMPREHENSIVE METABOLIC PANEL
ALT: 36 U/L (ref 0–44)
AST: 25 U/L (ref 15–41)
Albumin: 4.2 g/dL (ref 3.5–5.0)
Alkaline Phosphatase: 60 U/L (ref 38–126)
Anion gap: 9 (ref 5–15)
BUN: 11 mg/dL (ref 8–23)
CO2: 24 mmol/L (ref 22–32)
Calcium: 9 mg/dL (ref 8.9–10.3)
Chloride: 105 mmol/L (ref 98–111)
Creatinine, Ser: 0.97 mg/dL (ref 0.61–1.24)
GFR calc Af Amer: >60 mL/min (ref >60)
GFR calc non Af Amer: >60 mL/min (ref >60)
Glucose, Bld: 84 mg/dL (ref 70–99)
Potassium: 4 mmol/L (ref 3.5–5.1)
Sodium: 138 mmol/L (ref 135–145)
Total Bilirubin: 0.4 mg/dL (ref 0.3–1.2)
Total Protein: 7.3 g/dL (ref 6.5–8.1)

## 2019-12-02 LAB — CBC
HCT: 42.2 % (ref 39.0–52.0)
Hemoglobin: 13.5 g/dL (ref 13.0–17.0)
MCH: 24.7 pg — ABNORMAL LOW (ref 26.0–34.0)
MCHC: 32 g/dL (ref 30.0–36.0)
MCV: 77.1 fL — ABNORMAL LOW (ref 80.0–100.0)
Platelets: 213 10*3/uL (ref 150–400)
RBC: 5.47 MIL/uL (ref 4.22–5.81)
RDW: 15.7 % — ABNORMAL HIGH (ref 11.5–15.5)
WBC: 4 10*3/uL (ref 4.0–10.5)
nRBC: 0 % (ref 0.0–0.2)

## 2019-12-02 LAB — URINALYSIS, ROUTINE W REFLEX MICROSCOPIC
Bilirubin Urine: NEGATIVE
Glucose, UA: NEGATIVE mg/dL
Hgb urine dipstick: NEGATIVE
Ketones, ur: NEGATIVE mg/dL
Leukocytes,Ua: NEGATIVE
Nitrite: NEGATIVE
Protein, ur: NEGATIVE mg/dL
Specific Gravity, Urine: 1.024 (ref 1.005–1.030)
pH: 6 (ref 5.0–8.0)

## 2019-12-02 LAB — LIPASE, BLOOD: Lipase: 39 U/L (ref 11–51)

## 2019-12-02 NOTE — ED Notes (Signed)
Pt handed in labels and sts he is leaving

## 2019-12-02 NOTE — ED Triage Notes (Signed)
Patient c/o right upper abd/right flank intermittent pain past 6 months, burning pain. Pain has been worse the past 2 weeks.   3/10 pain  A/oX4 Ambulatory in triage

## 2019-12-04 ENCOUNTER — Other Ambulatory Visit: Payer: Self-pay

## 2019-12-04 ENCOUNTER — Emergency Department (HOSPITAL_COMMUNITY)
Admission: EM | Admit: 2019-12-04 | Discharge: 2019-12-04 | Disposition: A | Discharge status: Home / Self Care | Payer: No Typology Code available for payment source | Attending: Emergency Medicine | Admitting: Emergency Medicine

## 2019-12-04 ENCOUNTER — Encounter (HOSPITAL_COMMUNITY): Payer: Self-pay

## 2019-12-04 DIAGNOSIS — Z5321 Procedure and treatment not carried out due to patient leaving prior to being seen by health care provider: Secondary | ICD-10-CM | POA: Diagnosis not present

## 2019-12-04 DIAGNOSIS — R109 Unspecified abdominal pain: Secondary | ICD-10-CM | POA: Diagnosis present

## 2019-12-04 LAB — COMPREHENSIVE METABOLIC PANEL
ALT: 41 U/L (ref 0–44)
AST: 32 U/L (ref 15–41)
Albumin: 4.3 g/dL (ref 3.5–5.0)
Alkaline Phosphatase: 62 U/L (ref 38–126)
Anion gap: 10 (ref 5–15)
BUN: 12 mg/dL (ref 8–23)
CO2: 23 mmol/L (ref 22–32)
Calcium: 9.4 mg/dL (ref 8.9–10.3)
Chloride: 106 mmol/L (ref 98–111)
Creatinine, Ser: 0.97 mg/dL (ref 0.61–1.24)
GFR calc Af Amer: >60 mL/min (ref >60)
GFR calc non Af Amer: >60 mL/min (ref >60)
Glucose, Bld: 111 mg/dL — ABNORMAL HIGH (ref 70–99)
Potassium: 3.8 mmol/L (ref 3.5–5.1)
Sodium: 139 mmol/L (ref 135–145)
Total Bilirubin: 0.8 mg/dL (ref 0.3–1.2)
Total Protein: 7.2 g/dL (ref 6.5–8.1)

## 2019-12-04 LAB — CBC
HCT: 42.2 % (ref 39.0–52.0)
Hemoglobin: 13.6 g/dL (ref 13.0–17.0)
MCH: 24.6 pg — ABNORMAL LOW (ref 26.0–34.0)
MCHC: 32.2 g/dL (ref 30.0–36.0)
MCV: 76.4 fL — ABNORMAL LOW (ref 80.0–100.0)
Platelets: 224 10*3/uL (ref 150–400)
RBC: 5.52 MIL/uL (ref 4.22–5.81)
RDW: 15.7 % — ABNORMAL HIGH (ref 11.5–15.5)
WBC: 3.9 10*3/uL — ABNORMAL LOW (ref 4.0–10.5)
nRBC: 0 % (ref 0.0–0.2)

## 2019-12-04 LAB — URINALYSIS, ROUTINE W REFLEX MICROSCOPIC
Bilirubin Urine: NEGATIVE
Glucose, UA: NEGATIVE mg/dL
Hgb urine dipstick: NEGATIVE
Ketones, ur: NEGATIVE mg/dL
Leukocytes,Ua: NEGATIVE
Nitrite: NEGATIVE
Protein, ur: NEGATIVE mg/dL
Specific Gravity, Urine: 1.02 (ref 1.005–1.030)
pH: 6 (ref 5.0–8.0)

## 2019-12-04 LAB — LIPASE, BLOOD: Lipase: 40 U/L (ref 11–51)

## 2019-12-04 NOTE — ED Triage Notes (Signed)
Patient c/o intermittent right abdominal pain x 6 months. Patient states pain episodes have been more frequent and intense. patient denies any N/V/D or problems with urination.

## 2020-09-20 ENCOUNTER — Encounter (HOSPITAL_COMMUNITY): Payer: Self-pay

## 2021-10-05 ENCOUNTER — Emergency Department (HOSPITAL_COMMUNITY)
Admission: EM | Admit: 2021-10-05 | Discharge: 2021-10-05 | Disposition: A | Discharge status: Home / Self Care | Payer: No Typology Code available for payment source | Attending: Emergency Medicine | Admitting: Emergency Medicine

## 2021-10-05 ENCOUNTER — Other Ambulatory Visit: Payer: Self-pay

## 2021-10-05 ENCOUNTER — Encounter (HOSPITAL_COMMUNITY): Payer: Self-pay | Admitting: *Deleted

## 2021-10-05 DIAGNOSIS — J45909 Unspecified asthma, uncomplicated: Secondary | ICD-10-CM | POA: Diagnosis not present

## 2021-10-05 DIAGNOSIS — H60502 Unspecified acute noninfective otitis externa, left ear: Secondary | ICD-10-CM | POA: Diagnosis not present

## 2021-10-05 DIAGNOSIS — E119 Type 2 diabetes mellitus without complications: Secondary | ICD-10-CM | POA: Insufficient documentation

## 2021-10-05 DIAGNOSIS — H9202 Otalgia, left ear: Secondary | ICD-10-CM | POA: Diagnosis present

## 2021-10-05 MED ORDER — NEOMYCIN-POLYMYXIN-HC 3.5-10000-1 OT SUSP
3.0000 [drp] | Freq: Three times a day (TID) | OTIC | 0 refills | Status: AC
Start: 2021-10-05 — End: ?

## 2021-10-05 NOTE — ED Provider Notes (Signed)
[facility] EMERGENCY DEPARTMENT Provider Note   CSN: [id number] Arrival date & time: 10/05/21  1307     History  Chief Complaint  Patient presents with   Ear Pain    [name] is a 66 y.o. male with medical history of asthma, iron deficiency anemia.  The patient presents to ED for evaluation of left-sided ear pain.  Patient states that for the last 3 days he has had progressively worsening left-sided ear pain.  The patient denies drainage, loss of hearing, fevers, nausea or vomiting.  The patient states that he does have mild pain rated as 6 out of 10 to the left ear.  Patient reports that 3 months ago he had shingles which is on the left side of his body. Patient denies trying any over-the-counter medications to alleviate pain.  Patient denies history of ear infections, diabetes, recent swimming, recent sinus infections, facial pain.  HPI     Home Medications Prior to Admission medications   Medication Sig Start Date End Date Taking? Authorizing Provider  neomycin-polymyxin-hydrocortisone (CORTISPORIN) 3.5-10000-1 OTIC suspension Place 3 drops into the left ear 3 (three) times daily. 10/05/21  Yes Al Decant, PA-C  acetaminophen (TYLENOL) 500 MG tablet Take 2 tablets (1,000 mg total) by mouth every 8 (eight) hours as needed. 09/20/18   Law, Waylan Boga, PA-C  albuterol (PROVENTIL HFA;VENTOLIN HFA) 108 (90 Base) MCG/ACT inhaler Inhale 2 puffs into the lungs every 6 (six) hours as needed for wheezing or shortness of breath.    [provider]  atorvastatin (LIPITOR) 20 MG tablet Take 10 mg by mouth daily.    [provider]  Cholecalciferol (VITAMIN D3 PO) Take 2,000 Units by mouth daily.    [provider]  cyclobenzaprine (FLEXERIL) 10 MG tablet Take 10 mg by mouth 3 (three) times daily as needed for muscle spasms.    [provider]  famotidine (PEPCID) 20 MG tablet Take 20 mg by mouth 2 (two) times daily.    [provider]  gabapentin (NEURONTIN) 300 MG capsule Take 300 mg by mouth at bedtime.    [provider]  MOMETASONE FURO-FORMOTEROL FUM IN Inhale into the lungs.    [provider]      Allergies    Hydrocodone    Review of Systems   Review of Systems  Constitutional:  Negative for chills and fever.  HENT:  Positive for ear pain. Negative for ear discharge, hearing loss, sinus pressure and sinus pain.   Gastrointestinal:  Negative for nausea and vomiting.  All other systems reviewed and are negative.   Physical Exam Updated Vital Signs BP (!) 142/66 (BP Location: Right Arm)   Pulse 77   Temp (!) 97.5 F (36.4 C) (Oral)   Resp 18   SpO2 96%  Physical Exam Vitals and nursing note reviewed.  Constitutional:      General: He is not in acute distress.    Appearance: Normal appearance. He is not ill-appearing, toxic-appearing or diaphoretic.  HENT:     Head: Normocephalic and atraumatic.     Right Ear: Tympanic membrane normal.     Left Ear: Hearing and tympanic membrane normal. No decreased hearing noted. Tenderness present. No laceration, drainage or swelling.     Ears:     Comments: Erythematous external auditory canal.  TM intact.    Nose: Nose normal.     Mouth/Throat:     Mouth: Mucous membranes are moist.     Pharynx: Oropharynx is  clear.  Eyes:     Extraocular Movements: Extraocular movements intact.     Conjunctiva/sclera: Conjunctivae normal.     Pupils: Pupils are equal, round, and reactive to light.  Cardiovascular:     Rate and Rhythm: Normal rate and regular rhythm.  Pulmonary:     Effort: Pulmonary effort is normal.     Breath sounds: Normal breath sounds. No wheezing.  Abdominal:     General: Abdomen is flat. Bowel sounds are normal.     Palpations: Abdomen is soft.     Tenderness: There is no abdominal tenderness.  Musculoskeletal:     Cervical back: Normal range of motion and neck supple. No tenderness.  Skin:    General: Skin is warm and  dry.     Capillary Refill: Capillary refill takes less than 2 seconds.  Neurological:     Mental Status: He is alert and oriented to person, place, and time.     ED Results / Procedures / Treatments   Labs (all labs ordered are listed, but only abnormal results are displayed) Labs Reviewed - No data to display  EKG None  Radiology No results found.  Procedures Procedures   Medications Ordered in ED Medications - No data to display  ED Course/ Medical Decision Making/ A&P                           Medical Decision Making  66 year old male presents to the ED for evaluation.  Please see HPI for further details.  On examination, the patient's left external auditory canal is erythematous without evidence of tympanic membrane perforation.  There is no mastoid tenderness.  There is no appreciable drainage.  Patient is afebrile, nontachycardic.  Patient lung sounds clear bilaterally, not hypoxic on room air.  Patient be placed on polymyxin otic drops 3 times daily for the next 7 days.  Patient asked if I could send these to the [place] so that he can pick up in 2 days because he is going out of town today however I advised the patient that beginning to take the medicine as soon as possible was in his best interest.  Patient requested that I did send the medication to [facility] across the street.  Patient advised to follow-up with PCP if symptoms are not resolving.  Patient was given return precautions and he voiced understanding.  The patient had all of his questions answered his satisfaction.  The patient is stable at this time for discharge home   Final Clinical Impression(s) / ED Diagnoses Final diagnoses:  Acute otitis externa of left ear, unspecified type    Rx / DC Orders ED Discharge Orders          Ordered    neomycin-polymyxin-hydrocortisone (CORTISPORIN) 3.5-10000-1 OTIC suspension  3 times daily        10/05/21 1412              Al Decant,  PA-C 10/05/21 1414    Gloris Manchester, MD 10/07/21 1208

## 2021-10-05 NOTE — Discharge Instructions (Signed)
Please return to the ED with any new symptoms such as fevers, nausea or vomiting, ear drainage Please begin taking antibiotic drops I prescribed you.  You will take these 3 times daily for the next 7 days. [facility] if your symptoms are failing to resolve You may take ibuprofen every 6 hours for pain relief Please read the attached informational guide concerning otitis externa

## 2021-10-05 NOTE — ED Triage Notes (Signed)
Pt in c/o L ear pain onset x 3 days, denies injury, trauma, & drainage

## 2023-05-21 ENCOUNTER — Other Ambulatory Visit: Payer: Self-pay | Admitting: Internal Medicine

## 2023-05-21 DIAGNOSIS — M545 Low back pain, unspecified: Secondary | ICD-10-CM

## 2023-06-12 ENCOUNTER — Ambulatory Visit: Admission: RE | Admit: 2023-06-12 | Source: Ambulatory Visit

## 2023-10-20 ENCOUNTER — Ambulatory Visit (HOSPITAL_COMMUNITY)
Admission: RE | Admit: 2023-10-20 | Discharge: 2023-10-20 | Disposition: A | Discharge status: Home / Self Care | Source: Ambulatory Visit | Attending: Internal Medicine

## 2023-10-20 DIAGNOSIS — M545 Low back pain, unspecified: Secondary | ICD-10-CM | POA: Diagnosis present

## 2024-03-13 ENCOUNTER — Inpatient Hospital Stay (HOSPITAL_COMMUNITY): Admission: RE | Admit: 2024-03-13 | Discharge: 2024-03-13 | Attending: Ophthalmology

## 2024-03-13 NOTE — Pre-Procedure Instructions (Signed)
 Attempted pre-op phone call. Left VM for him to call us back.

## 2024-03-14 ENCOUNTER — Encounter (HOSPITAL_COMMUNITY): Payer: Self-pay

## 2024-03-14 ENCOUNTER — Other Ambulatory Visit: Payer: Self-pay

## 2024-03-14 NOTE — Pre-Procedure Instructions (Signed)
 Pre-op phone call done. I messaged Robin @ [place] because I did not have patients paperwork. Grayce states he was canceled due to not receiving his [facility] approval for surgery. I called patient back and left him a message about what Grayce had said and told him not to come on Monday. I again hollace Grayce to let her know that I had to leave him a message about the cancellation.

## 2024-03-17 ENCOUNTER — Encounter: Admission: RE | Payer: Self-pay

## 2024-03-17 ENCOUNTER — Ambulatory Visit (HOSPITAL_COMMUNITY): Admission: RE | Admit: 2024-03-17 | Admitting: Ophthalmology

## 2024-03-17 SURGERY — PHACOEMULSIFICATION, CATARACT, WITH IOL INSERTION
Anesthesia: Monitor Anesthesia Care | Laterality: Left

## 2024-04-01 ENCOUNTER — Encounter (HOSPITAL_COMMUNITY)

## 2024-04-07 ENCOUNTER — Ambulatory Visit (HOSPITAL_COMMUNITY): Admit: 2024-04-07 | Admitting: Ophthalmology

## 2024-04-07 SURGERY — PHACOEMULSIFICATION, CATARACT, WITH IOL INSERTION
Anesthesia: Monitor Anesthesia Care | Laterality: Right
